# Patient Record
Sex: Male | Born: 1965
Health system: Southern US, Community
[De-identification: ages and names within clinical notes are randomized; demographics above are authoritative.]

## PROBLEM LIST (undated history)

## (undated) DIAGNOSIS — D229 Melanocytic nevi, unspecified: Secondary | ICD-10-CM

## (undated) DIAGNOSIS — IMO0001 Reserved for inherently not codable concepts without codable children: Secondary | ICD-10-CM

## (undated) DIAGNOSIS — R7303 Prediabetes: Secondary | ICD-10-CM

## (undated) DIAGNOSIS — R030 Elevated blood-pressure reading, without diagnosis of hypertension: Secondary | ICD-10-CM

## (undated) DIAGNOSIS — F419 Anxiety disorder, unspecified: Secondary | ICD-10-CM

## (undated) DIAGNOSIS — G8929 Other chronic pain: Secondary | ICD-10-CM

## (undated) DIAGNOSIS — I10 Essential (primary) hypertension: Secondary | ICD-10-CM

## (undated) DIAGNOSIS — R569 Unspecified convulsions: Secondary | ICD-10-CM

## (undated) DIAGNOSIS — K219 Gastro-esophageal reflux disease without esophagitis: Secondary | ICD-10-CM

## (undated) DIAGNOSIS — M549 Dorsalgia, unspecified: Secondary | ICD-10-CM

## (undated) DIAGNOSIS — R55 Syncope and collapse: Secondary | ICD-10-CM

## (undated) HISTORY — DX: Other chronic pain: G89.29

## (undated) HISTORY — DX: Unspecified convulsions: R56.9

## (undated) HISTORY — DX: Reserved for inherently not codable concepts without codable children: IMO0001

## (undated) HISTORY — DX: Dorsalgia, unspecified: M54.9

## (undated) HISTORY — PX: APPENDECTOMY: SHX54

## (undated) HISTORY — DX: Elevated blood-pressure reading, without diagnosis of hypertension: R03.0

## (undated) HISTORY — PX: VASECTOMY: SHX75

## (undated) HISTORY — DX: Gastro-esophageal reflux disease without esophagitis: K21.9

## (undated) HISTORY — DX: Prediabetes: R73.03

## (undated) HISTORY — PX: OTHER SURGICAL HISTORY: SHX169

## (undated) HISTORY — DX: Melanocytic nevi, unspecified: D22.9

## (undated) HISTORY — DX: Syncope and collapse: R55

---

## 2009-06-06 ENCOUNTER — Ambulatory Visit: Payer: Self-pay | Admitting: Gastroenterology

## 2009-06-06 LAB — HM COLONOSCOPY

## 2013-05-06 LAB — LIPID PANEL
CHOLESTEROL: 198 mg/dL (ref 0–200)
HDL: 42 mg/dL (ref 35–70)
LDL Cholesterol: 130 mg/dL
TRIGLYCERIDES: 130 mg/dL (ref 40–160)

## 2014-02-08 ENCOUNTER — Ambulatory Visit: Payer: Self-pay | Admitting: Family Medicine

## 2014-06-10 LAB — HEMOGLOBIN A1C: HEMOGLOBIN A1C: 5.9

## 2014-06-10 LAB — PSA: PSA: 1.7

## 2015-01-18 ENCOUNTER — Encounter: Payer: Self-pay | Admitting: Family Medicine

## 2015-01-18 ENCOUNTER — Ambulatory Visit (INDEPENDENT_AMBULATORY_CARE_PROVIDER_SITE_OTHER): Payer: BLUE CROSS/BLUE SHIELD | Admitting: Family Medicine

## 2015-01-18 VITALS — BP 136/84 | HR 70 | Temp 98.5°F | Resp 16 | Ht 66.0 in | Wt 189.8 lb

## 2015-01-18 DIAGNOSIS — M25551 Pain in right hip: Secondary | ICD-10-CM | POA: Diagnosis not present

## 2015-01-18 DIAGNOSIS — Z8781 Personal history of (healed) traumatic fracture: Secondary | ICD-10-CM | POA: Insufficient documentation

## 2015-01-18 DIAGNOSIS — G8929 Other chronic pain: Secondary | ICD-10-CM | POA: Diagnosis not present

## 2015-01-18 DIAGNOSIS — D229 Melanocytic nevi, unspecified: Secondary | ICD-10-CM | POA: Insufficient documentation

## 2015-01-18 DIAGNOSIS — Z23 Encounter for immunization: Secondary | ICD-10-CM | POA: Diagnosis not present

## 2015-01-18 DIAGNOSIS — M79644 Pain in right finger(s): Secondary | ICD-10-CM

## 2015-01-18 DIAGNOSIS — K219 Gastro-esophageal reflux disease without esophagitis: Secondary | ICD-10-CM | POA: Insufficient documentation

## 2015-01-18 DIAGNOSIS — M549 Dorsalgia, unspecified: Secondary | ICD-10-CM

## 2015-01-18 DIAGNOSIS — R739 Hyperglycemia, unspecified: Secondary | ICD-10-CM | POA: Insufficient documentation

## 2015-01-18 MED ORDER — TRAMADOL HCL 50 MG PO TABS: 50.0000 mg | ORAL_TABLET | Freq: Every evening | ORAL | Status: DC | PRN

## 2015-01-18 NOTE — Progress Notes (Signed)
Name: Kevin Bernard   MRN: [id number]    DOB: [birth year]   Date:01/18/2015       Progress Note  Subjective  Chief Complaint  Chief Complaint  Patient presents with  . Hand Pain    right onset 3 months and worsening.  In the thumb area, patient states it locks up.  . Hip Pain    right, onset 3 months and worsening.    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 It has been going on for the past few months. Pain is sharp when it locks, but otherwise aching when he applies pressure on the mcp joint. No redness, no increase in warmth.   Right hip pain: he has a history of femur fracture when he was 49 yo and has noticed right hip pain for the past few months, pain is described as daily, aching like, sometimes feels numb on his groin. Currently pain is 3/10, but can get worse at night and it affects his sleep, waking up during the night because of the pain. He has noticed that he has been limping during the pain. Pain seems to be worse when sitting or sleeping. Not taking any medication for it at this time  GERD: doing well, no heartburn or regurgitation , weaning self off Omeprazole, taking it every other day  Patient Active Problem List   Diagnosis Date Noted  . Back pain, chronic 01/18/2015  . Gastro-esophageal reflux disease without esophagitis 01/18/2015  . Numerous moles 01/18/2015  . Hyperglycemia 01/18/2015  . History of fracture of femur 01/18/2015    Past Surgical History  Procedure Laterality Date  . Appendectomy    . Vasectomy    . Right femur internal fixiation      No family history on file.  Social History   Social History  . Marital Status: Married    Spouse Name: N/A  . Number of Children: N/A  . Years of Education: N/A   Occupational History  . Not on file.   Social History Main Topics  . Smoking status: Former Research scientist (life sciences)  . Smokeless tobacco: Never Used  . Alcohol Use: 0.0 oz/week    0  Standard drinks or equivalent per week  . Drug Use: No  . Sexual Activity: Yes   Other Topics Concern  . Not on file   Social History Narrative  . No narrative on file     Current outpatient prescriptions:  .  omeprazole (PRILOSEC) 10 MG capsule, Take 10 mg by mouth daily., Disp: , Rfl:   No Known Allergies   ROS  Constitutional: Negative for fever , positive for weight change.  Respiratory: Negative for cough and shortness of breath.   Cardiovascular: Negative for chest pain or palpitations.  Gastrointestinal: Negative for abdominal pain, no bowel changes.  Musculoskeletal: Positive  for gait problem no  joint swelling.  Skin: Negative for rash.  Neurological: Negative for dizziness or headache.  No other specific complaints in a complete review of systems (except as listed in HPI above).  Objective  Filed Vitals:   01/18/15 1422  BP: 136/84  Pulse: 70  Temp: 98.5 F (36.9 C)  TempSrc: Oral  Resp: 16  Height: 5\' 6"  (1.676 m)  Weight: 189 lb 12.8 oz (86.093 kg)  SpO2: 97%    Body mass index is 30.65 kg/(m^2).  Physical Exam  Constitutional: Patient appears well-developed and well-nourished. Obese No distress.  HEENT:  head atraumatic, normocephalic, pupils equal and reactive to light, neck supple, throat within normal limits Cardiovascular: Normal rate, regular rhythm and normal heart sounds.  No murmur heard. No BLE edema. Pulmonary/Chest: Effort normal and breath sounds normal. No respiratory distress. Abdominal: Soft.  There is no tenderness. Psychiatric: Patient has a normal mood and affect. behavior is normal. Judgment and thought content normal Muscular Skeletal: pain with internal rotation of right hip, also pain with flexion of right thumb and lateral bending of right wrist, thumb did not lock during exam, no redness or increase in warmth, no pain during palpation of lumbar spine, negative straight leg raise   PHQ2/9: Depression screen PHQ 2/9  01/18/2015  Decreased Interest 0  Down, Depressed, Hopeless 0  PHQ - 2 Score 0    Fall Risk: Fall Risk  01/18/2015  Falls in the past year? No    Functional Status Survey: Is the patient deaf or have difficulty hearing?: No Does the patient have difficulty seeing, even when wearing glasses/contacts?: Yes (glasses) Does the patient have difficulty concentrating, remembering, or making decisions?: No Does the patient have difficulty walking or climbing stairs?: No Does the patient have difficulty dressing or bathing?: No Does the patient have difficulty doing errands alone such as visiting a doctor's office or shopping?: No    Assessment & Plan  1. Thumb pain, right  - Ambulatory referral to Orthopedic Surgery  2. Needs flu shot  - Flu Vaccine QUAD 36+ mos PF IM (Fluarix & Fluzone Quad PF) - refused  3. Chronic hip pain, right  - Ambulatory referral to Orthopedic Surgery - traMADol (ULTRAM) 50 MG tablet; Take 1 tablet (50 mg total) by mouth at bedtime as needed for moderate pain.  Dispense: 30 tablet; Refill: 2

## 2015-12-29 ENCOUNTER — Encounter: Payer: Self-pay | Admitting: Family Medicine

## 2015-12-29 ENCOUNTER — Ambulatory Visit (INDEPENDENT_AMBULATORY_CARE_PROVIDER_SITE_OTHER): Payer: BLUE CROSS/BLUE SHIELD | Admitting: Family Medicine

## 2015-12-29 VITALS — BP 126/74 | HR 87 | Temp 98.2°F | Resp 16 | Ht 66.0 in | Wt 192.7 lb

## 2015-12-29 DIAGNOSIS — R5383 Other fatigue: Secondary | ICD-10-CM | POA: Diagnosis not present

## 2015-12-29 DIAGNOSIS — R0683 Snoring: Secondary | ICD-10-CM

## 2015-12-29 DIAGNOSIS — Z0000 Encounter for general adult medical examination without abnormal findings: Secondary | ICD-10-CM | POA: Diagnosis not present

## 2015-12-29 DIAGNOSIS — Z13220 Encounter for screening for lipoid disorders: Secondary | ICD-10-CM

## 2015-12-29 DIAGNOSIS — R739 Hyperglycemia, unspecified: Secondary | ICD-10-CM | POA: Diagnosis not present

## 2015-12-29 DIAGNOSIS — Z23 Encounter for immunization: Secondary | ICD-10-CM | POA: Diagnosis not present

## 2015-12-29 DIAGNOSIS — Z125 Encounter for screening for malignant neoplasm of prostate: Secondary | ICD-10-CM

## 2015-12-29 DIAGNOSIS — K648 Other hemorrhoids: Secondary | ICD-10-CM | POA: Insufficient documentation

## 2015-12-29 MED ORDER — ASPIRIN EC 81 MG PO TBEC: 81.0000 mg | DELAYED_RELEASE_TABLET | Freq: Every day | ORAL | 0 refills | Status: DC

## 2015-12-29 NOTE — Progress Notes (Signed)
Name: Kevin Bernard   MRN: [id number]    DOB: 02/10/1965   Date:12/29/2015       Progress Note  Subjective  Chief Complaint  Chief Complaint  Patient presents with  . Annual Exam    HPI  Male Exam: he snores at night, wakes up multiple times during the night, he recently changed his diet - stopped eating sweets and got dizzy at work, but resolved when he ate a pie. He states erection is not as good, but not bad enough to have medication at this time.   IPSS Questionnaire (AUA-7): Over the past month.   1)  How often have you had a sensation of not emptying your bladder completely after you finish urinating?  0 - Not at all  2)  How often have you had to urinate again less than two hours after you finished urinating? 0 - Not at all  3)  How often have you found you stopped and started again several times when you urinated?  0 - Not at all  4) How difficult have you found it to postpone urination?  0 - Not at all  5) How often have you had a weak urinary stream?  0 - Not at all  6) How often have you had to push or strain to begin urination?  0 - Not at all  7) How many times did you most typically get up to urinate from the time you went to bed until the time you got up in the morning?  1 - 1 time  Total score:  0-7 mildly symptomatic   8-19 moderately symptomatic   20-35 severely symptomatic    Patient Active Problem List   Diagnosis Date Noted  . Internal hemorrhoids 12/29/2015  . Back pain, chronic 01/18/2015  . Gastro-esophageal reflux disease without esophagitis 01/18/2015  . Numerous moles 01/18/2015  . Hyperglycemia 01/18/2015  . History of fracture of femur 01/18/2015    Past Surgical History:  Procedure Laterality Date  . APPENDECTOMY    . right femur internal fixiation    . VASECTOMY      Family History  Problem Relation Age of Onset  . CVA Mother   . Cancer Father 107    Pancreatic  . Diabetes Father   . Depression Brother   . Depression Daughter   .  Epilepsy Brother     Social History   Social History  . Marital status: Married    Spouse name: Abigail Butts  . Number of children: 4  . Years of education: N/A   Occupational History  . techinal specialist  [place] History Main Topics  . Smoking status: Former Smoker    Packs/day: 1.00    Years: 2.00    Quit date: 01/30/1988  . Smokeless tobacco: Never Used  . Alcohol use 0.0 oz/week  . Drug use: No  . Sexual activity: Yes   Other Topics Concern  . Not on file   Social History Narrative   Married, they have 4 children.         Current Outpatient Prescriptions:  Marland Kitchen  Multiple Vitamin (MULTIVITAMINS PO), Take 1 tablet by mouth daily., Disp: , Rfl:  .  omeprazole (PRILOSEC) 10 MG capsule, Take 10 mg by mouth daily., Disp: , Rfl:  .  traMADol (ULTRAM) 50 MG tablet, Take 1 tablet (50 mg total) by mouth at bedtime as needed for moderate pain., Disp: 30 tablet, Rfl: 2 .  aspirin EC 81 MG tablet,  Take 1 tablet (81 mg total) by mouth daily., Disp: 30 tablet, Rfl: 0  No Known Allergies   ROS  Constitutional: Negative for fever or weight change.  Respiratory: Negative for cough and shortness of breath.   Cardiovascular: Negative for chest pain, occasional palpitations - usually when anxious.  Gastrointestinal: Negative for abdominal pain, no bowel changes.  Musculoskeletal: Negative for gait problem or joint swelling.  Skin: positive for left knee rash ( resolving ) .  Neurological: Positive  for dizziness intermittent but no  headache.  No other specific complaints in a complete review of systems (except as listed in HPI above).   Objective  Vitals:   12/29/15 0824  BP: 126/74  Pulse: 87  Resp: 16  Temp: 98.2 F (36.8 C)  TempSrc: Oral  SpO2: 96%  Weight: 192 lb 11.2 oz (87.4 kg)  Height: 5\' 6"  (1.676 m)    Body mass index is 31.1 kg/m.  Physical Exam  Constitutional: Patient appears well-developed and obese. No distress.  HENT: Head:  Normocephalic and atraumatic. Ears: B TMs ok, no erythema or effusion; Nose: Nose normal. Mouth/Throat: Oropharynx is clear and moist. No oropharyngeal exudate.  Eyes: Conjunctivae and EOM are normal. Pupils are equal, round, and reactive to light. No scleral icterus.  Neck: Normal range of motion. Neck supple. No JVD present. No thyromegaly present.  Cardiovascular: Normal rate, regular rhythm and normal heart sounds.  No murmur heard. No BLE edema. Pulmonary/Chest: Effort normal and breath sounds normal. No respiratory distress. Abdominal: Soft. Bowel sounds are normal, no distension. There is no tenderness. no masses MALE GENITALIA: Normal descended testes bilaterally, no masses palpated, no hernias, no lesions, no discharge RECTAL:rectal exam not done Musculoskeletal: Normal range of motion, no joint effusions. No gross deformities Neurological: he is alert and oriented to person, place, and time. No cranial nerve deficit. Coordination, balance, strength, speech and gait are normal.  Skin: Skin is warm and dry. No rash noted. No erythema.  Psychiatric: Patient has a normal mood and affect. behavior is normal. Judgment and thought content normal.  PHQ2/9: Depression screen [facility] 2/9 12/29/2015 01/18/2015  Decreased Interest 0 0  Down, Depressed, Hopeless 0 0  PHQ - 2 Score 0 0    Fall Risk: Fall Risk  12/29/2015 01/18/2015  Falls in the past year? No No    Functional Status Survey: Is the patient deaf or have difficulty hearing?: No Does the patient have difficulty seeing, even when wearing glasses/contacts?: No Does the patient have difficulty concentrating, remembering, or making decisions?: No Does the patient have difficulty walking or climbing stairs?: No Does the patient have difficulty dressing or bathing?: No Does the patient have difficulty doing errands alone such as visiting a doctor's office or shopping?: No   Assessment & Plan  1. Well adult exam  Discussed  importance of 150 minutes of physical activity weekly, eat two servings of fish weekly, eat one serving of tree nuts ( cashews, pistachios, pecans, almonds.Marland Kitchen) every other day, eat 6 servings of fruit/vegetables daily and drink plenty of water and avoid sweet beverages.   2. Needs flu shot  refused  3. Lipid screening  - Lipid panel  4. Hyperglycemia  - Hemoglobin A1c  5. Snoring  - ESS 14 Sleep Study  6. Other fatigue  - COMPLETE METABOLIC PANEL WITH GFR - CBC with Differential/Platelet  7. Prostate cancer screening  Discussed USPTF he would like to have PSA done - PSA

## 2015-12-30 LAB — CBC WITH DIFFERENTIAL/PLATELET
BASOS PCT: 1 %
Basophils Absolute: 64 cells/uL (ref 0–200)
EOS PCT: 1 %
Eosinophils Absolute: 64 cells/uL (ref 15–500)
HCT: 49.3 % (ref 38.5–50.0)
Hemoglobin: 17 g/dL (ref 13.2–17.1)
Lymphocytes Relative: 32 %
Lymphs Abs: 2048 cells/uL (ref 850–3900)
MCH: 30.7 pg (ref 27.0–33.0)
MCHC: 34.5 g/dL (ref 32.0–36.0)
MCV: 89 fL (ref 80.0–100.0)
MONOS PCT: 9 %
MPV: 10.5 fL (ref 7.5–12.5)
Monocytes Absolute: 576 cells/uL (ref 200–950)
NEUTROS ABS: 3648 {cells}/uL (ref 1500–7800)
Neutrophils Relative %: 57 %
PLATELETS: 202 10*3/uL (ref 140–400)
RBC: 5.54 MIL/uL (ref 4.20–5.80)
RDW: 13 % (ref 11.0–15.0)
WBC: 6.4 10*3/uL (ref 3.8–10.8)

## 2015-12-31 LAB — COMPLETE METABOLIC PANEL WITH GFR
ALBUMIN: 4.4 g/dL (ref 3.6–5.1)
ALK PHOS: 64 U/L (ref 40–115)
ALT: 22 U/L (ref 9–46)
AST: 18 U/L (ref 10–35)
BILIRUBIN TOTAL: 0.8 mg/dL (ref 0.2–1.2)
BUN: 16 mg/dL (ref 7–25)
CO2: 26 mmol/L (ref 20–31)
Calcium: 9.2 mg/dL (ref 8.6–10.3)
Chloride: 104 mmol/L (ref 98–110)
Creat: 1.09 mg/dL (ref 0.70–1.33)
GFR, EST NON AFRICAN AMERICAN: 79 mL/min (ref >=60)
Glucose, Bld: 100 mg/dL — ABNORMAL HIGH (ref 65–99)
Potassium: 4.7 mmol/L (ref 3.5–5.3)
Sodium: 138 mmol/L (ref 135–146)
TOTAL PROTEIN: 6.5 g/dL (ref 6.1–8.1)

## 2015-12-31 LAB — PSA: PSA: 1.7 ng/mL (ref <=4.0)

## 2015-12-31 LAB — LIPID PANEL
Cholesterol: 200 mg/dL — ABNORMAL HIGH (ref <200)
HDL: 37 mg/dL — AB (ref >40)
LDL CALC: 145 mg/dL — AB (ref <100)
TRIGLYCERIDES: 92 mg/dL (ref <150)
Total CHOL/HDL Ratio: 5.4 Ratio — ABNORMAL HIGH (ref <5.0)
VLDL: 18 mg/dL (ref <30)

## 2015-12-31 LAB — HEMOGLOBIN A1C
Hgb A1c MFr Bld: 5.6 % (ref <5.7)
MEAN PLASMA GLUCOSE: 114 mg/dL

## 2016-05-07 ENCOUNTER — Encounter: Payer: Self-pay | Admitting: Family Medicine

## 2016-05-07 ENCOUNTER — Ambulatory Visit (INDEPENDENT_AMBULATORY_CARE_PROVIDER_SITE_OTHER): Payer: BLUE CROSS/BLUE SHIELD | Admitting: Family Medicine

## 2016-05-07 VITALS — BP 128/74 | HR 95 | Temp 98.3°F | Resp 16 | Wt 192.9 lb

## 2016-05-07 DIAGNOSIS — F418 Other specified anxiety disorders: Secondary | ICD-10-CM | POA: Diagnosis not present

## 2016-05-07 DIAGNOSIS — K219 Gastro-esophageal reflux disease without esophagitis: Secondary | ICD-10-CM | POA: Diagnosis not present

## 2016-05-07 DIAGNOSIS — R42 Dizziness and giddiness: Secondary | ICD-10-CM

## 2016-05-07 MED ORDER — SERTRALINE HCL 50 MG PO TABS: ORAL_TABLET | ORAL | 0 refills | Status: DC

## 2016-05-07 MED ORDER — MECLIZINE HCL 25 MG PO TABS: 25.0000 mg | ORAL_TABLET | Freq: Three times a day (TID) | ORAL | 0 refills | Status: DC | PRN

## 2016-05-07 NOTE — Patient Instructions (Addendum)
Consider ranitidine for heartburn if needed Start the sertraline tomorrow in the morning Use the meclizine or antivert if needed We'll have you see the specialist (neurologist) If you have not heard anything from my staff in a week about any orders/referrals/studies from today, please contact us here to follow-up [phone]

## 2016-05-07 NOTE — Progress Notes (Signed)
BP 128/74   Pulse 95   Temp 98.3 F (36.8 C) (Oral)   Resp 16   Wt 192 lb 14.4 oz (87.5 kg)   SpO2 97%   BMI 31.13 kg/m    Subjective:    Patient ID: Kevin Bernard, male    DOB: 1965-11-18, 51 y.o.   MRN: [id number]  HPI: Kevin Bernard is a 51 y.o. male  Chief Complaint  Patient presents with  . Dizziness    with nausea, onset 2 weeks    Patient is here for an acute visit; he is new to me; his usual provider is unavailable Has been having issues with dizziness, nausea, not just 2 weeks, actually months Shadow off to the sides Regular annual eye exams; everything fine, prescription, Dr. Wallace Going Getting dizzy spells  Used to get these all the time Nothing new Occasional dizzy spells, not clearing up He has not seen ENT He had the sleep study recommended No hx of tubes or ear infections Epilepsy; age 47 was last seizure Vasovagal episodes; very cautious; last episode 9-10 years No old head injuries Some spinning, not sure wihich Feel like he's going to pass out Happening daily Fine right now Forgot where he was going to work, got his bearings, used to driving same route and that was a few months ago Prediabetic Omeprazole was taking one a day for reflux, then stopped (weaned) Spicy foods are triggers Heart rate staying in the 90s Anxiety associated with these; has panic attacks; worried wife this weekend; he never cires and he was anxious like he had seen a ghost; sitting completely relaxed No anti-anxiety medicines have been tried Anxiety part just started, not the dizzy spells Friend had vertigo; asked if medicine  Depression screen [facility] 2/9 05/07/2016 12/29/2015 01/18/2015  Decreased Interest 0 0 0  Down, Depressed, Hopeless 0 0 0  PHQ - 2 Score 0 0 0   Relevant past medical, surgical, family and social history reviewed Past Medical History:  Diagnosis Date  . Chronic back pain   . Elevated blood pressure   . GERD (gastroesophageal reflux disease)   .  Numerous moles   . Pre-diabetes   . Seizures ([place])   . Vasovagal syncope    Past Surgical History:  Procedure Laterality Date  . APPENDECTOMY    . right femur internal fixiation    . VASECTOMY     Family History  Problem Relation Age of Onset  . CVA Mother   . Cancer Father 46    Pancreatic  . Diabetes Father   . Depression Brother   . Depression Daughter   . Epilepsy Brother    Social History  Substance Use Topics  . Smoking status: Former Smoker    Packs/day: 1.00    Years: 2.00    Quit date: 01/30/1988  . Smokeless tobacco: Never Used  . Alcohol use 0.0 oz/week   Interim medical history since last visit reviewed. Allergies and medications reviewed  Review of Systems Per HPI unless specifically indicated above     Objective:    BP 128/74   Pulse 95   Temp 98.3 F (36.8 C) (Oral)   Resp 16   Wt 192 lb 14.4 oz (87.5 kg)   SpO2 97%   BMI 31.13 kg/m   Wt Readings from Last 3 Encounters:  05/07/16 192 lb 14.4 oz (87.5 kg)  12/29/15 192 lb 11.2 oz (87.4 kg)  01/18/15 189 lb 12.8 oz (86.1 kg)    Physical Exam  Constitutional: He appears well-developed and well-nourished. No distress.  HENT:  Head: Normocephalic and atraumatic.  Right Ear: Hearing, tympanic membrane, external ear and ear canal normal. No drainage. Tympanic membrane is not erythematous and not retracted. No middle ear effusion.  Left Ear: Hearing, tympanic membrane, external ear and ear canal normal. No drainage. Tympanic membrane is not erythematous and not retracted.  No middle ear effusion.  Nose: No rhinorrhea.  Mouth/Throat: Oropharynx is clear and moist.  Eyes: Conjunctivae and EOM are normal. Pupils are equal, round, and reactive to light. No scleral icterus. Right eye exhibits normal extraocular motion and no nystagmus. Left eye exhibits normal extraocular motion and no nystagmus.  Temporal visual fields intact bilaterally  Neck: No JVD present. Carotid bruit is not present. No  thyromegaly present.  Cardiovascular: Normal rate and regular rhythm.   Pulmonary/Chest: Effort normal and breath sounds normal.  Abdominal: Soft. Bowel sounds are normal. He exhibits no distension.  Musculoskeletal: He exhibits no edema.  Neurological: He displays no atrophy and no tremor. No cranial nerve deficit or sensory deficit. He exhibits normal muscle tone. He displays no seizure activity. Coordination and gait normal.  Romberg testing showed just slight sway to the right; no pronator drift; no dysdiadochokinesis; normal finger-to-nose and finger-to-nose-to-finger testing; heel-shin testing normal; normal gait, heel-toe walk, dorsiflexion, plantarflexion walk all normal  Skin: Skin is warm and dry. No pallor.  Psychiatric: He has a normal mood and affect. His behavior is normal. Judgment and thought content normal.   Results for orders placed or performed in visit on 12/29/15  Hemoglobin A1c  Result Value Ref Range   Hgb A1c MFr Bld 5.6 <5.7 %   Mean Plasma Glucose 114 mg/dL  PSA  Result Value Ref Range   PSA 1.7 <=4.0 ng/mL  Lipid panel  Result Value Ref Range   Cholesterol 200 (H) <200 mg/dL   Triglycerides 92 <150 mg/dL   HDL 37 (L) >40 mg/dL   Total CHOL/HDL Ratio 5.4 (H) <5.0 Ratio   VLDL 18 <30 mg/dL   LDL Cholesterol 145 (H) <100 mg/dL  COMPLETE METABOLIC PANEL WITH GFR  Result Value Ref Range   Sodium 138 135 - 146 mmol/L   Potassium 4.7 3.5 - 5.3 mmol/L   Chloride 104 98 - 110 mmol/L   CO2 26 20 - 31 mmol/L   Glucose, Bld 100 (H) 65 - 99 mg/dL   BUN 16 7 - 25 mg/dL   Creat 1.09 0.70 - 1.33 mg/dL   Total Bilirubin 0.8 0.2 - 1.2 mg/dL   Alkaline Phosphatase 64 40 - 115 U/L   AST 18 10 - 35 U/L   ALT 22 9 - 46 U/L   Total Protein 6.5 6.1 - 8.1 g/dL   Albumin 4.4 3.6 - 5.1 g/dL   Calcium 9.2 8.6 - 10.3 mg/dL   GFR, Est African American >89 >=60 mL/min   GFR, Est Non African American 79 >=60 mL/min  CBC with Differential/Platelet  Result Value Ref Range    WBC 6.4 3.8 - 10.8 K/uL   RBC 5.54 4.20 - 5.80 MIL/uL   Hemoglobin 17.0 13.2 - 17.1 g/dL   HCT 49.3 38.5 - 50.0 %   MCV 89.0 80.0 - 100.0 fL   MCH 30.7 27.0 - 33.0 pg   MCHC 34.5 32.0 - 36.0 g/dL   RDW 13.0 11.0 - 15.0 %   Platelets 202 140 - 400 K/uL   MPV 10.5 7.5 - 12.5 fL   Neutro Abs 3,648 1,500 -  7,800 cells/uL   Lymphs Abs 2,048 850 - 3,900 cells/uL   Monocytes Absolute 576 200 - 950 cells/uL   Eosinophils Absolute 64 15 - 500 cells/uL   Basophils Absolute 64 0 - 200 cells/uL   Neutrophils Relative % 57 %   Lymphocytes Relative 32 %   Monocytes Relative 9 %   Eosinophils Relative 1 %   Basophils Relative 1 %   Smear Review Criteria for review not met   HM COLONOSCOPY  Result Value Ref Range   HM Colonoscopy See Report (in chart) See Report (in chart), Patient Reported      Assessment & Plan:   Problem List Items Addressed This Visit      Digestive   Gastro-esophageal reflux disease without esophagitis    Will have him use H2 blocker in place of PPI      Relevant Medications   meclizine (ANTIVERT) 25 MG tablet     Other   Anxiety about health    Discussed options and patient and his wife would like him to start on medicine; f/u with Dr. Ancil Boozer in a few weeks, call sooner if any problems      Relevant Medications   sertraline (ZOLOFT) 50 MG tablet    Other Visit Diagnoses    Dizziness    -  Primary   discussed ddx; I'd like him to see neurology; rx for antivert; will hold on brain imaging ($$) as neuro may opt for MRI over CT scan; disc s/s stroke   Relevant Medications   sertraline (ZOLOFT) 50 MG tablet   meclizine (ANTIVERT) 25 MG tablet   Other Relevant Orders   Ambulatory referral to Neurology      Follow up plan: No Follow-up on file.  An after-visit summary was printed and given to the patient at [facility].  Please see the patient instructions which may contain other information and recommendations beyond what is mentioned above in the assessment  and plan.  Meds ordered this encounter  Medications  . sertraline (ZOLOFT) 50 MG tablet    Sig: Take one-half of a pill by mouth daily for four days, then one whole pill daily    Dispense:  28 tablet    Refill:  0  . meclizine (ANTIVERT) 25 MG tablet    Sig: Take 1 tablet (25 mg total) by mouth 3 (three) times daily as needed for dizziness.    Dispense:  30 tablet    Refill:  0    Orders Placed This Encounter  Procedures  . Ambulatory referral to Neurology   Discussed s/s of stroke, reasons to call 911 Face-to-face time with patient was more than 25 minutes, >50% time spent counseling and coordination of care

## 2016-05-08 DIAGNOSIS — F418 Other specified anxiety disorders: Secondary | ICD-10-CM | POA: Insufficient documentation

## 2016-05-08 DIAGNOSIS — R4589 Other symptoms and signs involving emotional state: Secondary | ICD-10-CM | POA: Insufficient documentation

## 2016-05-08 NOTE — Assessment & Plan Note (Signed)
Will have him use H2 blocker in place of PPI

## 2016-05-08 NOTE — Assessment & Plan Note (Signed)
Discussed options and patient and his wife would like him to start on medicine; f/u with Dr. Ancil Boozer in a few weeks, call sooner if any problems

## 2016-05-10 ENCOUNTER — Telehealth: Payer: Self-pay | Admitting: Family Medicine

## 2016-05-10 MED ORDER — RANITIDINE HCL 300 MG PO TABS: 300.0000 mg | ORAL_TABLET | Freq: Every day | ORAL | 3 refills | Status: DC

## 2016-05-10 NOTE — Telephone Encounter (Signed)
Patient is wanting a RX for an antacid medication to be sent the [facility] in [place] on [address]  Patient stated he discussed with Dr. Sanda Klein during his visit on 05/07/16.

## 2016-05-12 ENCOUNTER — Emergency Department (HOSPITAL_BASED_OUTPATIENT_CLINIC_OR_DEPARTMENT_OTHER): Payer: BLUE CROSS/BLUE SHIELD

## 2016-05-12 ENCOUNTER — Emergency Department (HOSPITAL_BASED_OUTPATIENT_CLINIC_OR_DEPARTMENT_OTHER)
Admission: EM | Admit: 2016-05-12 | Discharge: 2016-05-12 | Disposition: A | Discharge status: Home / Self Care | Payer: BLUE CROSS/BLUE SHIELD | Attending: Emergency Medicine | Admitting: Emergency Medicine

## 2016-05-12 ENCOUNTER — Encounter (HOSPITAL_BASED_OUTPATIENT_CLINIC_OR_DEPARTMENT_OTHER): Payer: Self-pay | Admitting: Emergency Medicine

## 2016-05-12 DIAGNOSIS — R55 Syncope and collapse: Secondary | ICD-10-CM | POA: Insufficient documentation

## 2016-05-12 DIAGNOSIS — Z87891 Personal history of nicotine dependence: Secondary | ICD-10-CM | POA: Diagnosis not present

## 2016-05-12 DIAGNOSIS — R5383 Other fatigue: Secondary | ICD-10-CM | POA: Insufficient documentation

## 2016-05-12 DIAGNOSIS — R0602 Shortness of breath: Secondary | ICD-10-CM | POA: Diagnosis not present

## 2016-05-12 DIAGNOSIS — R410 Disorientation, unspecified: Secondary | ICD-10-CM | POA: Diagnosis not present

## 2016-05-12 DIAGNOSIS — Z79899 Other long term (current) drug therapy: Secondary | ICD-10-CM | POA: Diagnosis not present

## 2016-05-12 DIAGNOSIS — R002 Palpitations: Secondary | ICD-10-CM | POA: Diagnosis not present

## 2016-05-12 DIAGNOSIS — R42 Dizziness and giddiness: Secondary | ICD-10-CM | POA: Insufficient documentation

## 2016-05-12 DIAGNOSIS — R0981 Nasal congestion: Secondary | ICD-10-CM | POA: Insufficient documentation

## 2016-05-12 HISTORY — DX: Anxiety disorder, unspecified: F41.9

## 2016-05-12 LAB — BASIC METABOLIC PANEL
ANION GAP: 8 (ref 5–15)
BUN: 18 mg/dL (ref 6–20)
CALCIUM: 8.9 mg/dL (ref 8.9–10.3)
CHLORIDE: 108 mmol/L (ref 101–111)
CO2: 22 mmol/L (ref 22–32)
Creatinine, Ser: 0.89 mg/dL (ref 0.61–1.24)
GFR calc non Af Amer: >60 mL/min (ref >60)
Glucose, Bld: 120 mg/dL — ABNORMAL HIGH (ref 65–99)
Potassium: 3.2 mmol/L — ABNORMAL LOW (ref 3.5–5.1)
SODIUM: 138 mmol/L (ref 135–145)

## 2016-05-12 LAB — HEPATIC FUNCTION PANEL
ALBUMIN: 4.3 g/dL (ref 3.5–5.0)
ALT: 24 U/L (ref 17–63)
AST: 23 U/L (ref 15–41)
Alkaline Phosphatase: 73 U/L (ref 38–126)
BILIRUBIN TOTAL: 0.7 mg/dL (ref 0.3–1.2)
Bilirubin, Direct: 0.1 mg/dL (ref 0.1–0.5)
Indirect Bilirubin: 0.6 mg/dL (ref 0.3–0.9)
TOTAL PROTEIN: 7 g/dL (ref 6.5–8.1)

## 2016-05-12 LAB — CBC
HEMATOCRIT: 43.5 % (ref 39.0–52.0)
Hemoglobin: 15.5 g/dL (ref 13.0–17.0)
MCH: 30.6 pg (ref 26.0–34.0)
MCHC: 35.6 g/dL (ref 30.0–36.0)
MCV: 86 fL (ref 78.0–100.0)
PLATELETS: 213 10*3/uL (ref 150–400)
RBC: 5.06 MIL/uL (ref 4.22–5.81)
RDW: 12.1 % (ref 11.5–15.5)
WBC: 7.6 10*3/uL (ref 4.0–10.5)

## 2016-05-12 LAB — TSH: TSH: 1.488 u[IU]/mL (ref 0.350–4.500)

## 2016-05-12 LAB — TROPONIN I: Troponin I: <0.03 ng/mL (ref <0.03)

## 2016-05-12 NOTE — ED Provider Notes (Signed)
[facility] Provider Note   CSN: [id number] Arrival date & time: 05/12/16  1020     History   Chief Complaint Chief Complaint  Patient presents with  . Palpitations    HPI Xerxes Agrusa is a 51 y.o. male.  Patient presents with concerns for rapid heart rate palpitations. Occurred at work. Not associated with chest pain. Some shortness of breath intermittent [place] for several weeks. Room air sats 100%. Patient seen by primary care doctor on Monday with lab tests done. For these complaints. Patient just hasn't felt right for about 3 weeks. Or longer. Patient recently started on Zoloft but symptoms were present prior to starting that. Patient states that he's been feeling as if he is going to pass out. Had some dizziness and lightheadedness but no true vertigo or room spinning. Primary care Dr. started on meclizine but he has not taken any of it. He is taking the Zoloft as prescribed. Again he just started that a few days ago. No worse symptoms since starting the Zoloft. Patient denies any significant headache he's had some congestion. No fevers. No motor weakness no numbness. No neck stiffness. No prior history of palpitations.      Past Medical History:  Diagnosis Date  . Anxiety   . Chronic back pain   . Elevated blood pressure   . GERD (gastroesophageal reflux disease)   . Numerous moles   . Pre-diabetes   . Seizures ([place])   . Vasovagal syncope     Patient Active Problem List   Diagnosis Date Noted  . Anxiety about health 05/08/2016  . Internal hemorrhoids 12/29/2015  . Back pain, chronic 01/18/2015  . Gastro-esophageal reflux disease without esophagitis 01/18/2015  . Numerous moles 01/18/2015  . Hyperglycemia 01/18/2015  . History of fracture of femur 01/18/2015    Past Surgical History:  Procedure Laterality Date  . APPENDECTOMY    . right femur internal fixiation    . VASECTOMY         Home Medications    Prior to Admission medications     Medication Sig Start Date End Date Taking? Authorizing Provider  ranitidine (ZANTAC) 300 MG tablet Take 1 tablet (300 mg total) by mouth at bedtime. 05/10/16  Yes Arnetha Courser, MD  sertraline (ZOLOFT) 50 MG tablet Take one-half of a pill by mouth daily for four days, then one whole pill daily 05/07/16  Yes Arnetha Courser, MD  aspirin EC 81 MG tablet Take 1 tablet (81 mg total) by mouth daily. 12/29/15   Steele Sizer, MD  meclizine (ANTIVERT) 25 MG tablet Take 1 tablet (25 mg total) by mouth 3 (three) times daily as needed for dizziness. 05/07/16   Arnetha Courser, MD  Multiple Vitamin (MULTIVITAMINS PO) Take 1 tablet by mouth daily.    Historical Provider, MD    Family History Family History  Problem Relation Age of Onset  . CVA Mother   . Cancer Father 76    Pancreatic  . Diabetes Father   . Depression Brother   . Depression Daughter   . Epilepsy Brother     Social History Social History  Substance Use Topics  . Smoking status: Former Smoker    Packs/day: 1.00    Years: 2.00    Quit date: 01/30/1988  . Smokeless tobacco: Never Used  . Alcohol use 0.0 oz/week     Allergies   Patient has no known allergies.   Review of Systems Review of Systems  Constitutional: Positive for  fatigue. Negative for fever.  HENT: Positive for congestion.   Eyes: Negative for visual disturbance.  Respiratory: Negative for shortness of breath.   Cardiovascular: Positive for palpitations. Negative for chest pain and leg swelling.  Gastrointestinal: Negative for abdominal pain, nausea and vomiting.  Genitourinary: Negative for dysuria.  Musculoskeletal: Negative for back pain and neck pain.  Skin: Negative for rash.  Neurological: Positive for dizziness and light-headedness. Negative for syncope, speech difficulty, weakness, numbness and headaches.  Hematological: Does not bruise/bleed easily.  Psychiatric/Behavioral: Positive for confusion. The patient is nervous/anxious.      Physical  Exam Updated Vital Signs BP (!) 138/95   Pulse 66   Temp 97.8 F (36.6 C) (Oral)   Resp 10   Ht 5\' 6"  (1.676 m)   Wt 83.9 kg   SpO2 98%   BMI 29.86 kg/m   Physical Exam  Constitutional: He is oriented to person, place, and time. He appears well-developed and well-nourished. No distress.  HENT:  Head: Normocephalic and atraumatic.  Mouth/Throat: Oropharynx is clear and moist.  Eyes: Conjunctivae and EOM are normal. Pupils are equal, round, and reactive to light.  Neck: Normal range of motion. Neck supple.  Cardiovascular: Normal rate, regular rhythm and normal heart sounds.   No murmur heard. Pulmonary/Chest: Effort normal and breath sounds normal. No respiratory distress.  Abdominal: Soft. Bowel sounds are normal. There is no tenderness.  Musculoskeletal: Normal range of motion.  Neurological: He is alert and oriented to person, place, and time. No cranial nerve deficit or sensory deficit. He exhibits normal muscle tone. Coordination normal.  Skin: Skin is warm.  Nursing note and vitals reviewed.    ED Treatments / Results  Labs (all labs ordered are listed, but only abnormal results are displayed) Labs Reviewed  BASIC METABOLIC PANEL - Abnormal; Notable for the following:       Result Value   Potassium 3.2 (*)    Glucose, Bld 120 (*)    All other components within normal limits  CBC  TROPONIN I  HEPATIC FUNCTION PANEL  TSH    EKG  EKG Interpretation  Date/Time:  Saturday May 12 2016 10:28:39 EDT Ventricular Rate:  94 PR Interval:  128 QRS Duration: 94 QT Interval:  376 QTC Calculation: 470 R Axis:   60 Text Interpretation:  Normal sinus rhythm Normal ECG No previous ECGs available Confirmed by Braxxton Stoudt  MD, Amra Shukla [phone]) on 05/12/2016 11:38:25 AM       Radiology Dg Chest 2 View  Result Date: 05/12/2016 CLINICAL DATA:  Shortness of breath and palpitations for several weeks. EXAM: CHEST  2 VIEW COMPARISON:  None. FINDINGS: This is a mildly low volume  film. The cardiomediastinal silhouette is unremarkable. There is no evidence of focal airspace disease, pulmonary edema, suspicious pulmonary nodule/mass, pleural effusion, or pneumothorax. No acute bony abnormalities are identified. IMPRESSION: No active cardiopulmonary disease. Electronically Signed   By: Margarette Canada M.D.   On: 05/12/2016 11:10   Ct Head Wo Contrast  Result Date: 05/12/2016 CLINICAL DATA:  Hx multiple syncopal episodes in past, some nausea, having heart palpitations x several weeks with sob EXAM: CT HEAD WITHOUT CONTRAST TECHNIQUE: Contiguous axial images were obtained from the base of the skull through the vertex without intravenous contrast. COMPARISON:  None. FINDINGS: Brain: No acute intracranial hemorrhage. No focal mass lesion. No CT evidence of acute infarction. No midline shift or mass effect. No hydrocephalus. Basilar cisterns are patent. Mild white matter hypodensities in the anterior limb of the  LEFT external capsule (image 12 series 1) Vascular: No hyperdense vessel or unexpected calcification. Skull: Normal. Negative for fracture or focal lesion. Sinuses/Orbits: No acute finding. Other: None. IMPRESSION: 1. No acute intracranial findings. 2. Minimal white matter microvascular disease. Electronically Signed   By: Suzy Bouchard M.D.   On: 05/12/2016 13:34    Procedures Procedures (including critical care time)  Medications Ordered in ED Medications - No data to display   Initial Impression / Assessment and Plan / ED Course  I have reviewed the triage vital signs and the nursing notes.  Pertinent labs & imaging results that were available during my care of the patient were reviewed by me and considered in my medical decision making (see chart for details).     Workup for the palpitations and feeling of near syncope without any acute findings. Chest x-ray head CT negative labs without significant abnormalities. Patient with cardiac monitoring here had no  arrhythmias. EKG without any acute findings. Thyroid stimulated hormone was ordered and is pending.   Recommend follow-up with your regular Dr. for recheck of the thyroid testing. Return for racing heart or palpitations lasting 40 minutes or longer.  Continue current medications.  Final Clinical Impressions(s) / ED Diagnoses   Final diagnoses:  Palpitations  Near syncope    New Prescriptions Discharge Medication List as of 05/12/2016  2:13 PM       Fredia Sorrow, MD 05/12/16 1635

## 2016-05-12 NOTE — Discharge Instructions (Signed)
Workup here without any acute findings. Recommend following back up with primary care doctor. Thyroid-stimulating hormone was ordered and is still pending. He'll need follow-up with your regular doctor get the results of that. Return for any new or worse symptoms. Return for any heart racing for 40 minutes or longer. No evidence of any arrhythmia or abnormalities on cardiac monitor EKG today. Head CT was negative chest x-ray was negative labs without significant abnormalities other than some mild low potassium.

## 2016-05-12 NOTE — ED Triage Notes (Signed)
Pt reports palpitations and SOB for intermittently for a few weeks. Today it started a few hours ago. Denies chests pain. Recently started on zoloft.

## 2016-05-12 NOTE — ED Notes (Signed)
Pt appears anxious. He was discussing multiple personal issues from home. Pt seems fidgety with his hands. Transported to x-ray.

## 2016-05-18 ENCOUNTER — Telehealth: Payer: Self-pay

## 2016-05-18 ENCOUNTER — Encounter: Payer: Self-pay | Admitting: Family Medicine

## 2016-05-18 ENCOUNTER — Ambulatory Visit (INDEPENDENT_AMBULATORY_CARE_PROVIDER_SITE_OTHER): Payer: BLUE CROSS/BLUE SHIELD | Admitting: Family Medicine

## 2016-05-18 VITALS — BP 116/74 | HR 81 | Temp 98.2°F | Resp 16 | Wt 184.2 lb

## 2016-05-18 DIAGNOSIS — R9431 Abnormal electrocardiogram [ECG] [EKG]: Secondary | ICD-10-CM

## 2016-05-18 DIAGNOSIS — F410 Panic disorder [episodic paroxysmal anxiety] without agoraphobia: Secondary | ICD-10-CM | POA: Diagnosis not present

## 2016-05-18 DIAGNOSIS — R000 Tachycardia, unspecified: Secondary | ICD-10-CM

## 2016-05-18 DIAGNOSIS — E876 Hypokalemia: Secondary | ICD-10-CM

## 2016-05-18 MED ORDER — POTASSIUM CHLORIDE ER 10 MEQ PO TBCR: 10.0000 meq | EXTENDED_RELEASE_TABLET | Freq: Two times a day (BID) | ORAL | 0 refills | Status: DC

## 2016-05-18 MED ORDER — FLUOXETINE HCL 20 MG PO TABS: 20.0000 mg | ORAL_TABLET | Freq: Every day | ORAL | 3 refills | Status: DC

## 2016-05-18 MED ORDER — LORAZEPAM 0.5 MG PO TABS: 0.2500 mg | ORAL_TABLET | Freq: Two times a day (BID) | ORAL | 0 refills | Status: DC | PRN

## 2016-05-18 NOTE — Progress Notes (Signed)
BP 116/74   Pulse 81   Temp 98.2 F (36.8 C) (Oral)   Resp 16   Wt 184 lb 3.2 oz (83.6 kg)   SpO2 95%   BMI 29.73 kg/m    Subjective:    Patient ID: Kevin Bernard, male    DOB: July 20, 1965, 51 y.o.   MRN: [id number]  HPI: Kevin Bernard is a 51 y.o. male  Chief Complaint  Patient presents with  . Follow-up    Medication ( dizziness )    HPI Patient is here with his wife for follow-up He actually went to the ER on 05/12/16; note reviewed, along with labs and EKG and CXR and head CT Heart rate was 140, 149, stayed in the 130s and wouldn't go away Sweaty and felt a little dizziness Potassium was low at 3.2; patient does not recall any replacement TSH normal Normal CBC No outbursts of anger Beads of sweats with this Gets super hot No dripping Reviewed EKG, no delta waves He had a holter monitor years ago More than one aunt has had a pacemaker on the father's side ------------------------------------------------- Head CT from 05/12/16 IMPRESSION: 1. No acute intracranial findings. 2. Minimal white matter microvascular disease.   Electronically Signed   By: Suzy Bouchard M.D.   On: 05/12/2016 13:34 ------------------------------------------------- Chest xray COMPARISON:  None.  FINDINGS: This is a mildly low volume film.  The cardiomediastinal silhouette is unremarkable.  There is no evidence of focal airspace disease, pulmonary edema, suspicious pulmonary nodule/mass, pleural effusion, or pneumothorax. No acute bony abnormalities are identified.  IMPRESSION: No active cardiopulmonary disease.   Electronically Signed   By: Margarette Canada M.D.   On: 05/12/2016 11:10 --------------------------------------------------  High cholesterol; not taking aspirin His daughter has panic attacks and anxiety and OCD; she is on fluoxetine and he is willing to try that  Depression screen [facility] 2/9 05/18/2016 05/07/2016 12/29/2015 01/18/2015  Decreased Interest 0 0 0 0    Down, Depressed, Hopeless 0 0 0 0  PHQ - 2 Score 0 0 0 0   Relevant past medical, surgical, family and social history reviewed Past Medical History:  Diagnosis Date  . Anxiety   . Chronic back pain   . Elevated blood pressure   . GERD (gastroesophageal reflux disease)   . Numerous moles   . Pre-diabetes   . Seizures ([place])   . Vasovagal syncope    Past Surgical History:  Procedure Laterality Date  . APPENDECTOMY    . right femur internal fixiation    . VASECTOMY     Family History  Problem Relation Age of Onset  . CVA Mother   . Cancer Father 56    Pancreatic  . Diabetes Father   . Depression Brother   . Depression Daughter   . Epilepsy Brother    Social History  Substance Use Topics  . Smoking status: Former Smoker    Packs/day: 1.00    Years: 2.00    Quit date: 01/30/1988  . Smokeless tobacco: Never Used  . Alcohol use 0.0 oz/week    Interim medical history since last visit reviewed. Allergies and medications reviewed  Review of Systems Per HPI unless specifically indicated above     Objective:    BP 116/74   Pulse 81   Temp 98.2 F (36.8 C) (Oral)   Resp 16   Wt 184 lb 3.2 oz (83.6 kg)   SpO2 95%   BMI 29.73 kg/m   Wt Readings from Last 3 Encounters:  05/18/16 184 lb 3.2 oz (83.6 kg)  05/12/16 185 lb (83.9 kg)  05/07/16 192 lb 14.4 oz (87.5 kg)    Physical Exam  Constitutional: He appears well-developed and well-nourished. No distress.  HENT:  Head: Normocephalic and atraumatic.  Right Ear: Hearing, tympanic membrane, external ear and ear canal normal. No drainage. Tympanic membrane is not erythematous and not retracted. No middle ear effusion.  Left Ear: Hearing, tympanic membrane, external ear and ear canal normal. No drainage. Tympanic membrane is not erythematous and not retracted.  No middle ear effusion.  Nose: No rhinorrhea.  Eyes: EOM are normal. No scleral icterus. Right eye exhibits normal extraocular motion and no nystagmus. Left eye  exhibits normal extraocular motion and no nystagmus.  Temporal visual fields intact bilaterally  Neck: No JVD present. Carotid bruit is not present.  Cardiovascular: Normal rate and regular rhythm.   Pulmonary/Chest: Effort normal and breath sounds normal.  Abdominal: Soft. Bowel sounds are normal. He exhibits no distension.  Musculoskeletal: He exhibits no edema.  Neurological: He displays no tremor.  Skin: No pallor.  Psychiatric: He has a normal mood and affect. His behavior is normal. Judgment and thought content normal.   Results for orders placed or performed during the hospital encounter of 05/12/16  CBC  Result Value Ref Range   WBC 7.6 4.0 - 10.5 K/uL   RBC 5.06 4.22 - 5.81 MIL/uL   Hemoglobin 15.5 13.0 - 17.0 g/dL   HCT 43.5 39.0 - 52.0 %   MCV 86.0 78.0 - 100.0 fL   MCH 30.6 26.0 - 34.0 pg   MCHC 35.6 30.0 - 36.0 g/dL   RDW 12.1 11.5 - 15.5 %   Platelets 213 150 - 400 K/uL  Troponin I  Result Value Ref Range   Troponin I <0.03 <0.03 ng/mL  Basic metabolic panel  Result Value Ref Range   Sodium 138 135 - 145 mmol/L   Potassium 3.2 (L) 3.5 - 5.1 mmol/L   Chloride 108 101 - 111 mmol/L   CO2 22 22 - 32 mmol/L   Glucose, Bld 120 (H) 65 - 99 mg/dL   BUN 18 6 - 20 mg/dL   Creatinine, Ser 0.89 0.61 - 1.24 mg/dL   Calcium 8.9 8.9 - 10.3 mg/dL   GFR calc non Af Amer >60 >60 mL/min   GFR calc Af Amer >60 >60 mL/min   Anion gap 8 5 - 15  Hepatic function panel  Result Value Ref Range   Total Protein 7.0 6.5 - 8.1 g/dL   Albumin 4.3 3.5 - 5.0 g/dL   AST 23 15 - 41 U/L   ALT 24 17 - 63 U/L   Alkaline Phosphatase 73 38 - 126 U/L   Total Bilirubin 0.7 0.3 - 1.2 mg/dL   Bilirubin, Direct 0.1 0.1 - 0.5 mg/dL   Indirect Bilirubin 0.6 0.3 - 0.9 mg/dL  TSH  Result Value Ref Range   TSH 1.488 0.350 - 4.500 uIU/mL  reviewed in detail w/patient and his wife     Assessment & Plan:   Problem List Items Addressed This Visit      Other   Tachycardia - Primary    Unlikely  that heart rate would go to 140s with just anxiety, panic attack      Relevant Orders   Ambulatory referral to Cardiology   Panic attack    Not sure if panic attacks are causing cardiac symptoms or other way around; will start fluoxetine, very limited Rx for benzo  given while working this up; discussed risk of addition, never ever mix with narcotics, alcohol, sleeping pills, etc.      Relevant Medications   FLUoxetine (PROZAC) 20 MG tablet   LORazepam (ATIVAN) 0.5 MG tablet   Abnormal EKG    Refer to cardiologist for symptoms, borderline QTc prolongation; explained that I am doubtful that a panic attack alone could send a heart rate to 149; possible, but not that I've seen; thyroid testing normal; patient will see cardiologist who may put him on beta-blocker, but I'm not going to do that today given his BP      Relevant Orders   Ambulatory referral to Cardiology    Other Visit Diagnoses    Hypokalemia       replacement given (Rx)       Follow up plan: No Follow-up on file.  An after-visit summary was printed and given to the patient at [facility].  Please see the patient instructions which may contain other information and recommendations beyond what is mentioned above in the assessment and plan.  Meds ordered this encounter  Medications  . potassium chloride (KLOR-CON 10) 10 MEQ tablet    Sig: Take 1 tablet (10 mEq total) by mouth 2 (two) times daily.    Dispense:  8 tablet    Refill:  0  . FLUoxetine (PROZAC) 20 MG tablet    Sig: Take 1 tablet (20 mg total) by mouth daily.    Dispense:  30 tablet    Refill:  3  . LORazepam (ATIVAN) 0.5 MG tablet    Sig: Take 0.5-1 tablets (0.25-0.5 mg total) by mouth 2 (two) times daily as needed for anxiety.    Dispense:  12 tablet    Refill:  0    Orders Placed This Encounter  Procedures  . Ambulatory referral to Cardiology

## 2016-05-18 NOTE — Patient Instructions (Addendum)
Try to limit saturated fats in your diet (bologna, hot dogs, barbeque, cheeseburgers, hamburgers, steak, bacon, sausage, cheese, etc.) and get more fresh fruits, vegetables, and whole grains Let's get you to see a cardiologist Stop the sertraline Start the fluoxetine tomorrow Use the lorazepam for stormy moments, panic attacks Do not drink any alcohol or take pain medicine (narcotics), or sleeping pills (melatonin is okay) within six hours of the panic attack medicine lorazepam Call me in 3 weeks with an update

## 2016-05-18 NOTE — Telephone Encounter (Signed)
[facility] pharm tech called to confirm that the patient is not on Zoloft and now on Prozac. Confirmed that Dr. lada canceled Zoloft and he is now on Prozac.

## 2016-05-18 NOTE — Assessment & Plan Note (Signed)
Unlikely that heart rate would go to 140s with just anxiety, panic attack

## 2016-05-18 NOTE — Assessment & Plan Note (Addendum)
Refer to cardiologist for symptoms, borderline QTc prolongation; explained that I am doubtful that a panic attack alone could send a heart rate to 149; possible, but not that I've seen; thyroid testing normal; patient will see cardiologist who may put him on beta-blocker, but I'm not going to do that today given his BP

## 2016-05-20 DIAGNOSIS — F410 Panic disorder [episodic paroxysmal anxiety] without agoraphobia: Secondary | ICD-10-CM | POA: Insufficient documentation

## 2016-05-20 NOTE — Assessment & Plan Note (Signed)
Not sure if panic attacks are causing cardiac symptoms or other way around; will start fluoxetine, very limited Rx for benzo given while working this up; discussed risk of addition, never ever mix with narcotics, alcohol, sleeping pills, etc.

## 2016-07-30 ENCOUNTER — Encounter: Payer: Self-pay | Admitting: Family Medicine

## 2016-07-30 ENCOUNTER — Ambulatory Visit (INDEPENDENT_AMBULATORY_CARE_PROVIDER_SITE_OTHER): Payer: BLUE CROSS/BLUE SHIELD | Admitting: Family Medicine

## 2016-07-30 VITALS — BP 128/84 | HR 87 | Temp 98.2°F | Resp 16 | Ht 66.0 in | Wt 179.2 lb

## 2016-07-30 DIAGNOSIS — F410 Panic disorder [episodic paroxysmal anxiety] without agoraphobia: Secondary | ICD-10-CM | POA: Diagnosis not present

## 2016-07-30 DIAGNOSIS — K219 Gastro-esophageal reflux disease without esophagitis: Secondary | ICD-10-CM | POA: Diagnosis not present

## 2016-07-30 MED ORDER — CITALOPRAM HYDROBROMIDE 10 MG PO TABS
10.0000 mg | ORAL_TABLET | Freq: Every day | ORAL | 0 refills | Status: DC
Start: 2016-07-30 — End: 2016-08-15

## 2016-07-30 MED ORDER — RANITIDINE HCL 300 MG PO TABS: 300.0000 mg | ORAL_TABLET | Freq: Every day | ORAL | 1 refills | Status: DC

## 2016-07-30 NOTE — Patient Instructions (Signed)
Today (07/30/16), Tomorrow (07/31/2016), and Wendesday (08/01/2016): Take 20mg  Prozac + 10mg  Celexa (Citalopram)  Then, stop Prozac, and continue Celexa.  If not feeling well, return in 4-5 days. Otherwise follow up in 1-2 weeks.

## 2016-07-30 NOTE — Progress Notes (Addendum)
Name: Kevin Bernard   MRN: [id number]    DOB: 04-08-1965   Date:07/30/2016       Progress Note  Subjective  Chief Complaint  Chief Complaint  Patient presents with  . Medication Reaction    notice hand and finger trembles while on Prozac. Had been on medication for 2 months    HPI  Pt presents with new onset fine tremor in bilateral hands x1 month. Stopped Zoloft and started Prozac about 2 months ago. No familial tremor. Never used the Ativan that he was prescribed, but keeps it on hand just in case.  Has been doing really well on the Prozac, but is having fine tremor in bilateral hands, and some mental "fog" on the medication and is interested in trying something different.  He has not had a panic attack since his last visit, and has been feeling less anxious.  GERD: Takes Zantac and feels much better with this medication than when he was taking Prilosec. He denies nausea or reflux.  Has been avoiding trigger foods.  Needs Zantac refill today.  Patient Active Problem List   Diagnosis Date Noted  . Panic attack 05/20/2016  . Abnormal EKG 05/18/2016  . Tachycardia 05/18/2016  . Anxiety about health 05/08/2016  . Internal hemorrhoids 12/29/2015  . Back pain, chronic 01/18/2015  . Gastro-esophageal reflux disease without esophagitis 01/18/2015  . Numerous moles 01/18/2015  . Hyperglycemia 01/18/2015  . History of fracture of femur 01/18/2015    Social History  Substance Use Topics  . Smoking status: Former Smoker    Packs/day: 1.00    Years: 2.00    Quit date: 01/30/1988  . Smokeless tobacco: Never Used  . Alcohol use 0.0 oz/week     Current Outpatient Prescriptions:  .  aspirin EC 81 MG tablet, Take 1 tablet (81 mg total) by mouth daily., Disp: 30 tablet, Rfl: 0 .  LORazepam (ATIVAN) 0.5 MG tablet, Take 0.5-1 tablets (0.25-0.5 mg total) by mouth 2 (two) times daily as needed for anxiety., Disp: 12 tablet, Rfl: 0 .  FLUoxetine (PROZAC) 20 MG tablet, Take 1 tablet (20 mg total) by  mouth daily., Disp: 30 tablet, Rfl: 3 .  meclizine (ANTIVERT) 25 MG tablet, Take 1 tablet (25 mg total) by mouth 3 (three) times daily as needed for dizziness. (Patient not taking: Reported on 05/18/2016), Disp: 30 tablet, Rfl: 0 .  Multiple Vitamin (MULTIVITAMINS PO), Take 1 tablet by mouth daily., Disp: , Rfl:  .  potassium chloride (KLOR-CON 10) 10 MEQ tablet, Take 1 tablet (10 mEq total) by mouth 2 (two) times daily. (Patient not taking: Reported on 07/30/2016), Disp: 8 tablet, Rfl: 0 .  ranitidine (ZANTAC) 300 MG tablet, Take 1 tablet (300 mg total) by mouth at bedtime. (Patient not taking: Reported on 07/30/2016), Disp: 30 tablet, Rfl: 3  No Known Allergies  ROS  Ten systems reviewed and is negative except as mentioned in HPI   Objective  Vitals:   07/30/16 1502  BP: 128/84  Pulse: 87  Resp: 16  Temp: 98.2 F (36.8 C)  TempSrc: Oral  SpO2: 98%  Weight: 179 lb 3.2 oz (81.3 kg)  Height: 5' 6"  (1.676 m)    Body mass index is 28.92 kg/m.  Nursing Note and Vital Signs reviewed.  Physical Exam  Constitutional: Patient appears well-developed and well-nourished. Obese No distress.  HEENT: head atraumatic, normocephalic, pupils equal and reactive to light, EOM's intact Cardiovascular: Normal rate, regular rhythm, S1/S2 present.  No murmur or rub heard. No  BLE edema. Pulmonary/Chest: Effort normal and breath sounds clear. No respiratory distress or retractions. Psychiatric: Patient has an moderately anxious mood and affect. behavior is normal. Judgment and thought content normal. Neurological: he is alert and oriented to person, place, and time. No cranial nerve deficit. Coordination, balance, strength, speech and gait are normal. Very slight tremor in bilateral finger tips. Skin: Skin is warm and dry. No rash noted. No erythema.    Recent Results (from the past 2160 hour(s))  CBC     Status: None   Collection Time: 05/12/16 11:12 AM  Result Value Ref Range   WBC 7.6 4.0 - 10.5  K/uL   RBC 5.06 4.22 - 5.81 MIL/uL   Hemoglobin 15.5 13.0 - 17.0 g/dL   HCT 43.5 39.0 - 52.0 %   MCV 86.0 78.0 - 100.0 fL   MCH 30.6 26.0 - 34.0 pg   MCHC 35.6 30.0 - 36.0 g/dL   RDW 12.1 11.5 - 15.5 %   Platelets 213 150 - 400 K/uL  Troponin I     Status: None   Collection Time: 05/12/16 11:12 AM  Result Value Ref Range   Troponin I <0.03 <0.03 ng/mL  Basic metabolic panel     Status: Abnormal   Collection Time: 05/12/16 11:54 AM  Result Value Ref Range   Sodium 138 135 - 145 mmol/L   Potassium 3.2 (L) 3.5 - 5.1 mmol/L   Chloride 108 101 - 111 mmol/L   CO2 22 22 - 32 mmol/L   Glucose, Bld 120 (H) 65 - 99 mg/dL   BUN 18 6 - 20 mg/dL   Creatinine, Ser 0.89 0.61 - 1.24 mg/dL   Calcium 8.9 8.9 - 10.3 mg/dL   GFR calc non Af Amer >60 >60 mL/min   GFR calc Af Amer >60 >60 mL/min    Comment: (NOTE) The eGFR has been calculated using the CKD EPI equation. This calculation has not been validated in all clinical situations. eGFR's persistently <60 mL/min signify possible Chronic Kidney Disease.    Anion gap 8 5 - 15  Hepatic function panel     Status: None   Collection Time: 05/12/16 11:54 AM  Result Value Ref Range   Total Protein 7.0 6.5 - 8.1 g/dL   Albumin 4.3 3.5 - 5.0 g/dL   AST 23 15 - 41 U/L   ALT 24 17 - 63 U/L   Alkaline Phosphatase 73 38 - 126 U/L   Total Bilirubin 0.7 0.3 - 1.2 mg/dL   Bilirubin, Direct 0.1 0.1 - 0.5 mg/dL   Indirect Bilirubin 0.6 0.3 - 0.9 mg/dL  TSH     Status: None   Collection Time: 05/12/16 12:47 PM  Result Value Ref Range   TSH 1.488 0.350 - 4.500 uIU/mL    Comment: Performed by a 3rd Generation assay with a functional sensitivity of <=0.01 uIU/mL. Performed at [facility], [place] [address], [place], [place] [address]      Assessment & Plan  1. Panic attack - citalopram (CELEXA) 10 MG tablet; Take 1 tablet (10 mg total) by mouth daily.  Dispense: 30 tablet; Refill: 0 - Taper and switch from Prozac to Celexa discussed per  AVS.  2. Gastro-esophageal reflux disease without esophagitis - ranitidine (ZANTAC) 300 MG tablet; Take 1 tablet (300 mg total) by mouth at bedtime.  Dispense: 90 tablet; Refill: 1  -Discussed stress reduction, meditation techniques, and sleep hygiene in detail with patient. - Advised follow up in 1 week, but pt  will be on vacation. Return in about 2 weeks (around 08/13/2016) for With Dr. Ancil Boozer PCP.  -Red flags and when to present for emergency care or RTC including fever >101.16F, chest pain, shortness of breath, new/worsening/un-resolving symptoms, reviewed with patient at time of visit. Follow up and care instructions discussed and provided in AVS.  I have reviewed this encounter including the documentation in this note and/or discussed this patient with the Johney Maine, FNP, NP-C. I am certifying that I agree with the content of this note as supervising physician.  Steele Sizer, MD [facility] 08/06/2016, 11:02 AM

## 2016-08-13 ENCOUNTER — Ambulatory Visit: Payer: BLUE CROSS/BLUE SHIELD | Admitting: Family Medicine

## 2016-08-15 ENCOUNTER — Ambulatory Visit (INDEPENDENT_AMBULATORY_CARE_PROVIDER_SITE_OTHER): Payer: BLUE CROSS/BLUE SHIELD | Admitting: Family Medicine

## 2016-08-15 ENCOUNTER — Encounter: Payer: Self-pay | Admitting: Family Medicine

## 2016-08-15 VITALS — BP 118/84 | HR 80 | Temp 98.1°F | Resp 18 | Wt 183.1 lb

## 2016-08-15 DIAGNOSIS — S8992XA Unspecified injury of left lower leg, initial encounter: Secondary | ICD-10-CM

## 2016-08-15 DIAGNOSIS — K219 Gastro-esophageal reflux disease without esophagitis: Secondary | ICD-10-CM | POA: Diagnosis not present

## 2016-08-15 DIAGNOSIS — F411 Generalized anxiety disorder: Secondary | ICD-10-CM | POA: Diagnosis not present

## 2016-08-15 DIAGNOSIS — F410 Panic disorder [episodic paroxysmal anxiety] without agoraphobia: Secondary | ICD-10-CM

## 2016-08-15 MED ORDER — CITALOPRAM HYDROBROMIDE 20 MG PO TABS: 20.0000 mg | ORAL_TABLET | Freq: Every day | ORAL | 0 refills | Status: DC

## 2016-08-15 NOTE — Patient Instructions (Signed)
[facility] [employer].

## 2016-08-15 NOTE — Progress Notes (Signed)
Name: Kevin Bernard   MRN: [id number]    DOB: 1965-03-20   Date:08/15/2016       Progress Note  Subjective  Chief Complaint  Chief Complaint  Patient presents with  . Anxiety    Memory Fogginess improved but anxiety has not been controlled. Raelyn Ensign swinged the patient off of Prozac and put him on Celexa last visit and told him to follow up with PCP in 2 weeks.   . Leg Injury    Left calf hit his leg again bed rail and caused a wound would like it checked out.    HPI  GAD: he tried Zoloft but did not control symptoms, responded to Prozac but it caused tremors, since 07/2016 he has been on Citalopram and is doing well, mental fogginess improved, no longer has tremors, he is feels anxious. He has gone through a lot over the past couple of months. Oldest son lives in [place] and drop out of college, was selling his house but it fell through at the last minute, twins are growing up and will be going to college soon and daughter is transferring to [place]. He states it has affected his marriage, they are very stressed out now. Discussed therapy, mindfulness.   Leg injury: he was at the beach and injured his left lateral ankle on a metal bed frame, it is healing, but he was concerned about infection because he went swimming in the ocean. No oozing or pain right now. Injury happened 10 days ago. No fever or chills.    Patient Active Problem List   Diagnosis Date Noted  . Panic attack 05/20/2016  . Abnormal EKG 05/18/2016  . Tachycardia 05/18/2016  . Anxiety about health 05/08/2016  . Internal hemorrhoids 12/29/2015  . Back pain, chronic 01/18/2015  . Gastro-esophageal reflux disease without esophagitis 01/18/2015  . Numerous moles 01/18/2015  . Hyperglycemia 01/18/2015  . History of fracture of femur 01/18/2015    Past Surgical History:  Procedure Laterality Date  . APPENDECTOMY    . right femur internal fixiation    . VASECTOMY      Family History  Problem Relation Age  of Onset  . CVA Mother   . Cancer Father 75       Pancreatic  . Diabetes Father   . Depression Brother   . Depression Daughter   . Epilepsy Brother     Social History   Social History  . Marital status: Married    Spouse name: Abigail Butts  . Number of children: 4  . 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 Smoking status: Former Smoker    Packs/day: 1.00    Years: 2.00    Quit date: 01/30/1988  . Smokeless tobacco: Never Used  . Alcohol use 0.0 oz/week  . Drug use: No  . Sexual activity: Yes   Other Topics Concern  . Not on file   Social History Narrative   Married, they have 4 children.         Current Outpatient Prescriptions:  .  aspirin EC 81 MG tablet, Take 1 tablet (81 mg total) by mouth daily., Disp: 30 tablet, Rfl: 0 .  citalopram (CELEXA) 10 MG tablet, Take 1 tablet (10 mg total) by mouth daily., Disp: 30 tablet, Rfl: 0 .  LORazepam (ATIVAN) 0.5 MG tablet, Take 0.5-1 tablets (0.25-0.5 mg total) by mouth 2 (two) times daily as needed for anxiety.,  Disp: 12 tablet, Rfl: 0 .  Multiple Vitamin (MULTIVITAMINS PO), Take 1 tablet by mouth daily., Disp: , Rfl:  .  ranitidine (ZANTAC) 300 MG tablet, Take 1 tablet (300 mg total) by mouth at bedtime., Disp: 90 tablet, Rfl: 1  No Known Allergies   ROS  Ten systems reviewed and is negative except as mentioned in HPI   Objective  Vitals:   08/15/16 0835  BP: 118/84  Pulse: 80  Resp: 18  Temp: 98.1 F (36.7 C)  TempSrc: Oral  SpO2: 97%  Weight: 183 lb 1.6 oz (83.1 kg)    Body mass index is 29.55 kg/m.  Physical Exam  Constitutional: Patient appears well-developed and well-nourished. Obese  No distress.  HEENT: head atraumatic, normocephalic, pupils equal and reactive to light,  neck supple, throat within normal limits Cardiovascular: Normal rate, regular rhythm and normal heart sounds.  No murmur heard. No BLE  edema. Pulmonary/Chest: Effort normal and breath sounds normal. No respiratory distress. Abdominal: Soft.  There is no tenderness. Psychiatric: Patient has a normal mood and affect. behavior is normal. Judgment and thought content normal. Skin: 1 inch in size with surrounded erythema, no tenderness during palpation of area, left lateral ankle.   PHQ2/9: Depression screen [facility] 2/9 08/15/2016 05/18/2016 05/07/2016 12/29/2015 01/18/2015  Decreased Interest 0 0 0 0 0  Down, Depressed, Hopeless 0 0 0 0 0  PHQ - 2 Score 0 0 0 0 0   GAD 7 : Generalized Anxiety Score 08/15/2016  Nervous, Anxious, on Edge 2  Control/stop worrying 3  Worry too much - different things 0  Trouble relaxing 1  Restless 2  Easily annoyed or irritable 0  Afraid - awful might happen 0  Total GAD 7 Score 8  Anxiety Difficulty Not difficult at all     Fall Risk: Fall Risk  08/15/2016 05/18/2016 05/07/2016 12/29/2015 01/18/2015  Falls in the past year? No No No No No     Functional Status Survey: Is the patient deaf or have difficulty hearing?: No Does the patient have difficulty seeing, even when wearing glasses/contacts?: No Does the patient have difficulty concentrating, remembering, or making decisions?: No Does the patient have difficulty walking or climbing stairs?: No Does the patient have difficulty dressing or bathing?: No Does the patient have difficulty doing errands alone such as visiting a doctor's office or shopping?: No   Assessment & Plan  1. Panic attack  - citalopram (CELEXA) 20 MG tablet; Take 1 tablet (20 mg total) by mouth daily.  Dispense: 30 tablet; Refill: 0  2. Gastro-esophageal reflux disease without esophagitis  Under control   3. GAD (generalized anxiety disorder)  - citalopram (CELEXA) 20 MG tablet; Take 1 tablet (20 mg total) by mouth daily.  Dispense: 30 tablet; Refill: 0  4. Injury of left lower extremity, initial encounter  Healing, Tdap is up to date

## 2016-09-10 ENCOUNTER — Other Ambulatory Visit: Payer: Self-pay | Admitting: Family Medicine

## 2016-09-10 DIAGNOSIS — F410 Panic disorder [episodic paroxysmal anxiety] without agoraphobia: Secondary | ICD-10-CM

## 2016-09-10 DIAGNOSIS — F411 Generalized anxiety disorder: Secondary | ICD-10-CM

## 2016-09-10 NOTE — Telephone Encounter (Signed)
Patient requesting refill of Celexa to [facility]. 

## 2016-09-26 ENCOUNTER — Ambulatory Visit (INDEPENDENT_AMBULATORY_CARE_PROVIDER_SITE_OTHER): Payer: BLUE CROSS/BLUE SHIELD | Admitting: Family Medicine

## 2016-09-26 ENCOUNTER — Encounter: Payer: Self-pay | Admitting: Family Medicine

## 2016-09-26 VITALS — BP 120/70 | HR 84 | Temp 98.2°F | Resp 18 | Ht 66.0 in | Wt 187.8 lb

## 2016-09-26 DIAGNOSIS — F410 Panic disorder [episodic paroxysmal anxiety] without agoraphobia: Secondary | ICD-10-CM

## 2016-09-26 DIAGNOSIS — F411 Generalized anxiety disorder: Secondary | ICD-10-CM

## 2016-09-26 MED ORDER — CITALOPRAM HYDROBROMIDE 20 MG PO TABS: 20.0000 mg | ORAL_TABLET | Freq: Every day | ORAL | 2 refills | Status: DC

## 2016-09-26 NOTE — Progress Notes (Signed)
Name: Kevin Bernard   MRN: [id number]    DOB: [birth year]   Date:09/26/2016       Progress Note  Subjective  Chief Complaint  Chief Complaint  Patient presents with  . Follow-up  . Medication Refill    HPI   GAD: he tried Zoloft but did not control symptoms, responded to Prozac but it caused tremors, since 07/2016 he has been on Citalopram and is doing well, mental fogginess improved, no longer has tremors, he is feels anxious. He has gone through a lot over the past couple of months. Oldest son lives in [place] and drop out of college, was selling his house but it fell through at the last minute, twins are growing up and will be going to college in 2 years  and daughter is now at [employer]. They are looking for another house, but states more relaxed, more engaged, and able to calm down when he is upset. He states Celexa causes mild delayed ejaculation. No other problems. He is happy with results, continue current regiment follow up in December  Patient Active Problem List   Diagnosis Date Noted  . Panic attack 05/20/2016  . Abnormal EKG 05/18/2016  . Tachycardia 05/18/2016  . Anxiety about health 05/08/2016  . Internal hemorrhoids 12/29/2015  . Back pain, chronic 01/18/2015  . Gastro-esophageal reflux disease without esophagitis 01/18/2015  . Numerous moles 01/18/2015  . Hyperglycemia 01/18/2015  . History of fracture of femur 01/18/2015    Past Surgical History:  Procedure Laterality Date  . APPENDECTOMY    . right femur internal fixiation    . VASECTOMY      Family History  Problem Relation Age of Onset  . CVA Mother   . Cancer Father 9       Pancreatic  . Diabetes Father   . Depression Brother   . Depression Daughter   . Epilepsy Brother     Social History   Social History  . Marital status: Married    Spouse name: Abigail Butts  . Number of children: 4  . Years of education: N/A   Occupational History  . techinal specialist  [employer]  History Main Topics  . Smoking status: Former Smoker    Packs/day: 1.00    Years: 2.00    Quit date: 01/30/1988  . Smokeless tobacco: Never Used  . Alcohol use 0.0 oz/week  . Drug use: No  . Sexual activity: Yes   Other Topics Concern  . Not on file   Social History Narrative   Married, they have 4 children.         Current Outpatient Prescriptions:  .  aspirin EC 81 MG tablet, Take 1 tablet (81 mg total) by mouth daily., Disp: 30 tablet, Rfl: 0 .  citalopram (CELEXA) 20 MG tablet, Take 1 tablet (20 mg total) by mouth daily., Disp: 30 tablet, Rfl: 2 .  LORazepam (ATIVAN) 0.5 MG tablet, Take 0.5-1 tablets (0.25-0.5 mg total) by mouth 2 (two) times daily as needed for anxiety., Disp: 12 tablet, Rfl: 0 .  Multiple Vitamin (MULTIVITAMINS PO), Take 1 tablet by mouth daily., Disp: , Rfl:  .  ranitidine (ZANTAC) 300 MG tablet, Take 1 tablet (300 mg total) by mouth at bedtime., Disp: 90 tablet, Rfl: 1  No Known Allergies   ROS  Ten systems reviewed and is negative except as mentioned in HPI   Objective  Vitals:   09/26/16 0747  BP: 120/70  Pulse: 84  Resp: 18  Temp: 98.2 F (36.8 C)  TempSrc: Oral  SpO2: 96%  Weight: 187 lb 12.8 oz (85.2 kg)  Height: 5\' 6"  (1.676 m)    Body mass index is 30.31 kg/m.  Physical Exam  Constitutional: Patient appears well-developed and well-nourished. Obese  No distress.  HEENT: head atraumatic, normocephalic, pupils equal and reactive to light,  neck supple, throat within normal limits Cardiovascular: Normal rate, regular rhythm and normal heart sounds.  No murmur heard. No BLE edema. Pulmonary/Chest: Effort normal and breath sounds normal. No respiratory distress. Abdominal: Soft.  There is no tenderness. Psychiatric: Patient has a normal mood and affect. behavior is normal. Judgment and thought content normal.  PHQ2/9: Depression screen [facility] 2/9 08/15/2016 05/18/2016 05/07/2016 12/29/2015 01/18/2015  Decreased Interest 0 0 0 0 0  Down,  Depressed, Hopeless 0 0 0 0 0  PHQ - 2 Score 0 0 0 0 0     Fall Risk: Fall Risk  08/15/2016 05/18/2016 05/07/2016 12/29/2015 01/18/2015  Falls in the past year? No No No No No     Assessment & Plan  1. GAD (generalized anxiety disorder)  - citalopram (CELEXA) 20 MG tablet; Take 1 tablet (20 mg total) by mouth daily.  Dispense: 30 tablet; Refill: 2  2. Panic attack  - citalopram (CELEXA) 20 MG tablet; Take 1 tablet (20 mg total) by mouth daily.  Dispense: 30 tablet; Refill: 2

## 2016-10-13 ENCOUNTER — Other Ambulatory Visit: Payer: Self-pay | Admitting: Family Medicine

## 2016-10-13 DIAGNOSIS — F411 Generalized anxiety disorder: Secondary | ICD-10-CM

## 2016-10-13 DIAGNOSIS — F410 Panic disorder [episodic paroxysmal anxiety] without agoraphobia: Secondary | ICD-10-CM

## 2016-12-31 ENCOUNTER — Encounter: Payer: BLUE CROSS/BLUE SHIELD | Admitting: Family Medicine

## 2017-01-13 ENCOUNTER — Other Ambulatory Visit: Payer: Self-pay | Admitting: Family Medicine

## 2017-01-13 DIAGNOSIS — F411 Generalized anxiety disorder: Secondary | ICD-10-CM

## 2017-01-13 DIAGNOSIS — F410 Panic disorder [episodic paroxysmal anxiety] without agoraphobia: Secondary | ICD-10-CM

## 2017-01-14 NOTE — Telephone Encounter (Signed)
Refill request for general medication: Celexa 20 mg  Last office visit: 09/26/2016  Last physical exam: 12/29/2015  Follow up visit; None indicated

## 2017-01-15 ENCOUNTER — Other Ambulatory Visit: Payer: Self-pay

## 2017-01-15 ENCOUNTER — Telehealth: Payer: Self-pay

## 2017-01-15 NOTE — Telephone Encounter (Signed)
Copied from [place] [id number]. Topic: General - Other >> Jan 15, 2017  1:09 PM Lolita Rieger, [place] wrote: Reason for CRM: rescheduled pt CPE for 04/09/17 but pt stated that he will need refills before then Please call pt

## 2017-01-15 NOTE — Telephone Encounter (Signed)
Refill on Celexa is already pending. Waiting on PCP to sign.

## 2017-03-16 ENCOUNTER — Other Ambulatory Visit: Payer: Self-pay | Admitting: Family Medicine

## 2017-04-09 ENCOUNTER — Encounter: Payer: Self-pay | Admitting: Family Medicine

## 2017-04-09 ENCOUNTER — Ambulatory Visit (INDEPENDENT_AMBULATORY_CARE_PROVIDER_SITE_OTHER): Payer: BLUE CROSS/BLUE SHIELD | Admitting: Family Medicine

## 2017-04-09 VITALS — BP 140/100 | HR 91 | Temp 98.1°F | Resp 14 | Ht 66.0 in | Wt 193.8 lb

## 2017-04-09 DIAGNOSIS — K219 Gastro-esophageal reflux disease without esophagitis: Secondary | ICD-10-CM | POA: Diagnosis not present

## 2017-04-09 DIAGNOSIS — F411 Generalized anxiety disorder: Secondary | ICD-10-CM

## 2017-04-09 DIAGNOSIS — Z23 Encounter for immunization: Secondary | ICD-10-CM

## 2017-04-09 DIAGNOSIS — Z13220 Encounter for screening for lipoid disorders: Secondary | ICD-10-CM | POA: Diagnosis not present

## 2017-04-09 DIAGNOSIS — Z131 Encounter for screening for diabetes mellitus: Secondary | ICD-10-CM | POA: Diagnosis not present

## 2017-04-09 DIAGNOSIS — F410 Panic disorder [episodic paroxysmal anxiety] without agoraphobia: Secondary | ICD-10-CM | POA: Diagnosis not present

## 2017-04-09 DIAGNOSIS — Z0001 Encounter for general adult medical examination with abnormal findings: Secondary | ICD-10-CM | POA: Diagnosis not present

## 2017-04-09 DIAGNOSIS — Z0000 Encounter for general adult medical examination without abnormal findings: Secondary | ICD-10-CM

## 2017-04-09 DIAGNOSIS — Z125 Encounter for screening for malignant neoplasm of prostate: Secondary | ICD-10-CM

## 2017-04-09 MED ORDER — CITALOPRAM HYDROBROMIDE 20 MG PO TABS: 20.0000 mg | ORAL_TABLET | Freq: Every day | ORAL | 1 refills | Status: DC

## 2017-04-09 MED ORDER — RANITIDINE HCL 300 MG PO TABS: 300.0000 mg | ORAL_TABLET | Freq: Every day | ORAL | 1 refills | Status: DC

## 2017-04-09 NOTE — Progress Notes (Signed)
Name: Kevin Bernard   MRN: [id number]    DOB: [birth year]   Date:04/09/2017       Progress Note  Subjective  Chief Complaint  Chief Complaint  Patient presents with  . Annual Exam    HPI  Patient presents for annual CPE and follow up   GAD: he tried Zoloft but did not control symptoms, responded to Prozac but it caused tremors, since 07/2016 he has been on Citalopram and is doing well. He was under a lot stress during the Spring 2018, however doing better now. Medication seems to help him cope with stress. They finally sold their house, moving to [place] once they close, he starting to have anxiety about it.   GERD: he is taking Ranitidine daily, states when he has symptoms. He likes to eat spicy food and has flares intermittently  USPSTF grade A and B recommendations:  Diet: eats mostly at home, not much vegetables but has smoothies at home.  Exercise: not very active at this time  Depression:  Depression screen [facility] 2/9 04/09/2017 04/09/2017 08/15/2016 05/18/2016 05/07/2016  Decreased Interest 0 0 0 0 0  Down, Depressed, Hopeless 0 0 0 0 0  PHQ - 2 Score 0 0 0 0 0  Altered sleeping 0 - - - -  Tired, decreased energy 0 - - - -  Change in appetite 0 - - - -  Feeling bad or failure about yourself  0 - - - -  Trouble concentrating 0 - - - -  Moving slowly or fidgety/restless 0 - - - -  Suicidal thoughts 0 - - - -  PHQ-9 Score 0 - - - -  Difficult doing work/chores Not difficult at all - - - -    Hypertension:  BP Readings from Last 3 Encounters:  04/09/17 (!) 140/100  09/26/16 120/70  08/15/16 118/84    Obesity: Wt Readings from Last 3 Encounters:  04/09/17 193 lb 12.8 oz (87.9 kg)  09/26/16 187 lb 12.8 oz (85.2 kg)  08/15/16 183 lb 1.6 oz (83.1 kg)   BMI Readings from Last 3 Encounters:  04/09/17 31.28 kg/m  09/26/16 30.31 kg/m  08/15/16 29.55 kg/m     Lipids:  Lab Results  Component Value Date   CHOL 200 (H) 12/29/2015   CHOL 198 05/06/2013   Lab Results   Component Value Date   HDL 37 (L) 12/29/2015   HDL 42 05/06/2013   Lab Results  Component Value Date   LDLCALC 145 (H) 12/29/2015   LDLCALC 130 05/06/2013   Lab Results  Component Value Date   TRIG 92 12/29/2015   TRIG 130 05/06/2013   Lab Results  Component Value Date   CHOLHDL 5.4 (H) 12/29/2015   No results found for: LDLDIRECT Glucose:  Glucose, Bld  Date Value Ref Range Status  05/12/2016 120 (H) 65 - 99 mg/dL Final  12/29/2015 100 (H) 65 - 99 mg/dL Final      Married STD testing and prevention (chl/gon/syphilis): N/A HIV, hep C: Not interested   Skin cancer: discussed change in moles Colorectal cancer: repeat 2021 Prostate cancer: discussed UsPTF he wants PSA  Lab Results  Component Value Date   PSA 1.7 12/29/2015   PSA 1.7 06/10/2014    IPSS Questionnaire (AUA-7): Over the past month.   1)  How often have you had a sensation of not emptying your bladder completely after you finish urinating?  0 - Not at all  2)  How often have you had to  urinate again less than two hours after you finished urinating? 1 - Less than 1 time in 5  3)  How often have you found you stopped and started again several times when you urinated?  0 - Not at all  4) How difficult have you found it to postpone urination?  0 - Not at all  5) How often have you had a weak urinary stream?  2 - Less than half the time  6) How often have you had to push or strain to begin urination?  1 - Less than 1 time in 5  7) How many times did you most typically get up to urinate from the time you went to bed until the time you got up in the morning?  1 - 1 time  Total score:  0-7 mildly symptomatic   8-19 moderately symptomatic   20-35 severely symptomatic   Lung cancer:   Low Dose CT Chest recommended if Age 55-80 years, 30 pack-year currently smoking OR have quit w/in 15years. Patient does not qualify.   AAA: The USPSTF recommends one-time screening with ultrasonography in men ages 27 to 2 years  who have ever smoked - not due yet  Aspirin: needs to start  ECG:  05/14/2016  Advanced Care Planning: A voluntary discussion about advance care planning including the explanation and discussion of advance directives.  Discussed health care proxy and Living will, and the patient was able to identify a health care proxy as wife  Patient does have a living will at present time. If patient does have living will, I have requested they bring this to the clinic to be scanned in to their chart.  Patient Active Problem List   Diagnosis Date Noted  . Panic attack 05/20/2016  . Abnormal EKG 05/18/2016  . Tachycardia 05/18/2016  . Anxiety about health 05/08/2016  . Internal hemorrhoids 12/29/2015  . Back pain, chronic 01/18/2015  . Gastro-esophageal reflux disease without esophagitis 01/18/2015  . Numerous moles 01/18/2015  . Hyperglycemia 01/18/2015  . History of fracture of femur 01/18/2015    Past Surgical History:  Procedure Laterality Date  . APPENDECTOMY    . right femur internal fixiation    . VASECTOMY      Family History  Problem Relation Age of Onset  . CVA Mother   . Cancer Father 3       Pancreatic  . Diabetes Father   . Depression Brother   . Depression Daughter   . Epilepsy Brother     Social History   Socioeconomic History  . Marital status: Married    Spouse name: Abigail Butts  . Number of children: 4  . Years of education: Not on file  . Highest education level: Some college, no degree  Social Needs  . Financial resource strain: Not hard at all  . Food insecurity - worry: Never true  . Food insecurity - inability: Never true  . Transportation needs - medical: No  . Transportation needs - non-medical: No  Occupational History  . Occupation: techinal specialist     Employer: [employer]  Tobacco Use  . Smoking status: Former Smoker    Packs/day: 1.00    Years: 2.00    Pack years: 2.00    Last attempt to quit: 01/30/1988    Years since quitting: 29.2  .  Smokeless tobacco: Never Used  Substance and Sexual Activity  . Alcohol use: Yes    Alcohol/week: 3.0 oz    Types: 5 Cans of beer  per week  . Drug use: No  . Sexual activity: Yes  Other Topics Concern  . Not on file  Social History Narrative   Married, they have 4 children.      Current Outpatient Medications:  .  aspirin EC 81 MG tablet, Take 1 tablet (81 mg total) by mouth daily., Disp: 30 tablet, Rfl: 0 .  citalopram (CELEXA) 20 MG tablet, Take 1 tablet (20 mg total) by mouth daily., Disp: 90 tablet, Rfl: 1 .  Multiple Vitamin (MULTIVITAMINS PO), Take 1 tablet by mouth daily., Disp: , Rfl:  .  ranitidine (ZANTAC) 300 MG tablet, Take 1 tablet (300 mg total) by mouth at bedtime., Disp: 90 tablet, Rfl: 1  No Known Allergies   ROS   Constitutional: Negative for fever or weight change.  Respiratory: Negative for cough and shortness of breath.   Cardiovascular: Negative for chest pain or palpitations.  Gastrointestinal: Negative for abdominal pain, no bowel changes.  Musculoskeletal: Negative for gait problem or joint swelling.  Skin: Negative for rash.  Neurological: Negative for dizziness or headache.  No other specific complaints in a complete review of systems (except as listed in HPI above).   Objective  Vitals:   04/09/17 1422 04/09/17 1452  BP: 140/82 (!) 140/100  Pulse: 91   Resp: 14   Temp: 98.1 F (36.7 C)   TempSrc: Oral   SpO2: 97%   Weight: 193 lb 12.8 oz (87.9 kg)   Height: 5\' 6"  (1.676 m)     Body mass index is 31.28 kg/m.  Physical Exam  Constitutional: Patient appears well-developed and obese . No distress.  HENT: Head: Normocephalic and atraumatic. Ears: B TMs ok, no erythema or effusion; Nose: Nose normal. Mouth/Throat: Oropharynx is clear and moist. No oropharyngeal exudate.  Eyes: Conjunctivae and EOM are normal. Pupils are equal, round, and reactive to light. No scleral icterus.  Neck: Normal range of motion. Neck supple. No JVD present.  No thyromegaly present.  Cardiovascular: Normal rate, regular rhythm and normal heart sounds.  No murmur heard. No BLE edema. Pulmonary/Chest: Effort normal and breath sounds normal. No respiratory distress. Abdominal: Soft. Bowel sounds are normal, no distension. There is no tenderness. no masses MALE GENITALIA: Normal descended testes bilaterally, no masses palpated, no hernias, no lesions, no discharge RECTAL: Prostate normal size and consistency, no rectal masses but he has a skin tag and internal hemorrhoids Musculoskeletal: Normal range of motion, no joint effusions. No gross deformities Neurological: he is alert and oriented to person, place, and time. No cranial nerve deficit. Coordination, balance, strength, speech and gait are normal.  Skin: Skin is warm and dry. No rash noted. No erythema.  Psychiatric: Patient has a normal mood and affect. behavior is normal. Judgment and thought content normal.    PHQ2/9: Depression screen [facility] 2/9 04/09/2017 04/09/2017 08/15/2016 05/18/2016 05/07/2016  Decreased Interest 0 0 0 0 0  Down, Depressed, Hopeless 0 0 0 0 0  PHQ - 2 Score 0 0 0 0 0  Altered sleeping 0 - - - -  Tired, decreased energy 0 - - - -  Change in appetite 0 - - - -  Feeling bad or failure about yourself  0 - - - -  Trouble concentrating 0 - - - -  Moving slowly or fidgety/restless 0 - - - -  Suicidal thoughts 0 - - - -  PHQ-9 Score 0 - - - -  Difficult doing work/chores Not difficult at all - - - -  GAD 7 : Generalized Anxiety Score 04/09/2017 08/15/2016  Nervous, Anxious, on Edge 1 2  Control/stop worrying 0 3  Worry too much - different things 1 0  Trouble relaxing 1 1  Restless 3 2  Easily annoyed or irritable 0 0  Afraid - awful might happen 0 0  Total GAD 7 Score 6 8  Anxiety Difficulty Somewhat difficult Not difficult at all      Fall Risk: Fall Risk  04/09/2017 08/15/2016 05/18/2016 05/07/2016 12/29/2015  Falls in the past year? No No No No No     Functional  Status Survey: Is the patient deaf or have difficulty hearing?: No Does the patient have difficulty seeing, even when wearing glasses/contacts?: No Does the patient have difficulty concentrating, remembering, or making decisions?: No Does the patient have difficulty walking or climbing stairs?: No Does the patient have difficulty dressing or bathing?: No Does the patient have difficulty doing errands alone such as visiting a doctor's office or shopping?: No   Assessment & Plan  1. Encounter for routine history and physical exam for male  Discussed importance of 150 minutes of physical activity weekly, eat two servings of fish weekly, eat one serving of tree nuts ( cashews, pistachios, pecans, almonds.Marland Kitchen) every other day, eat 6 servings of fruit/vegetables daily and drink plenty of water and avoid sweet beverages.  - Lipid panel - Hemoglobin A1c - COMPLETE METABOLIC PANEL WITH GFR - CBC with Differential/Platelet  2. Flu vaccine need  - Flu Vaccine QUAD 36+ mos IM  3. Gastro-esophageal reflux disease without esophagitis  - ranitidine (ZANTAC) 300 MG tablet; Take 1 tablet (300 mg total) by mouth at bedtime.  Dispense: 90 tablet; Refill: 1  4. GAD (generalized anxiety disorder)  - citalopram (CELEXA) 20 MG tablet; Take 1 tablet (20 mg total) by mouth daily.  Dispense: 90 tablet; Refill: 1  5. Panic attack  - citalopram (CELEXA) 20 MG tablet; Take 1 tablet (20 mg total) by mouth daily.  Dispense: 90 tablet; Refill: 1  6. Prostate cancer screening  - PSA  7. Lipid screening  - Lipid panel  8. Diabetes mellitus screening  - Hemoglobin A1c

## 2017-04-09 NOTE — Patient Instructions (Signed)

## 2017-04-10 LAB — COMPLETE METABOLIC PANEL WITH GFR
AG RATIO: 2.3 (calc) (ref 1.0–2.5)
ALKALINE PHOSPHATASE (APISO): 78 U/L (ref 40–115)
ALT: 25 U/L (ref 9–46)
AST: 25 U/L (ref 10–35)
Albumin: 4.4 g/dL (ref 3.6–5.1)
BILIRUBIN TOTAL: 0.5 mg/dL (ref 0.2–1.2)
BUN/Creatinine Ratio: 15 (calc) (ref 6–22)
BUN: 21 mg/dL (ref 7–25)
CHLORIDE: 109 mmol/L (ref 98–110)
CO2: 28 mmol/L (ref 20–32)
Calcium: 8.8 mg/dL (ref 8.6–10.3)
Creat: 1.36 mg/dL — ABNORMAL HIGH (ref 0.70–1.33)
GFR, EST AFRICAN AMERICAN: 69 mL/min/{1.73_m2} (ref >=60)
GFR, Est Non African American: 60 mL/min/{1.73_m2} (ref >=60)
GLUCOSE: 96 mg/dL (ref 65–139)
Globulin: 1.9 g/dL (calc) (ref 1.9–3.7)
POTASSIUM: 4.1 mmol/L (ref 3.5–5.3)
Sodium: 143 mmol/L (ref 135–146)
Total Protein: 6.3 g/dL (ref 6.1–8.1)

## 2017-04-10 LAB — LIPID PANEL
CHOL/HDL RATIO: 4.8 (calc) (ref <5.0)
Cholesterol: 191 mg/dL (ref <200)
HDL: 40 mg/dL — ABNORMAL LOW (ref >40)
LDL Cholesterol (Calc): 121 mg/dL (calc) — ABNORMAL HIGH
NON-HDL CHOLESTEROL (CALC): 151 mg/dL — AB (ref <130)
Triglycerides: 183 mg/dL — ABNORMAL HIGH (ref <150)

## 2017-04-10 LAB — CBC WITH DIFFERENTIAL/PLATELET
BASOS ABS: 50 {cells}/uL (ref 0–200)
BASOS PCT: 0.9 %
EOS ABS: 72 {cells}/uL (ref 15–500)
Eosinophils Relative: 1.3 %
HCT: 42.8 % (ref 38.5–50.0)
HEMOGLOBIN: 15 g/dL (ref 13.2–17.1)
Lymphs Abs: 1755 cells/uL (ref 850–3900)
MCH: 30.4 pg (ref 27.0–33.0)
MCHC: 35 g/dL (ref 32.0–36.0)
MCV: 86.6 fL (ref 80.0–100.0)
MPV: 11.3 fL (ref 7.5–12.5)
Monocytes Relative: 10 %
NEUTROS ABS: 3075 {cells}/uL (ref 1500–7800)
Neutrophils Relative %: 55.9 %
Platelets: 204 10*3/uL (ref 140–400)
RBC: 4.94 10*6/uL (ref 4.20–5.80)
RDW: 12 % (ref 11.0–15.0)
Total Lymphocyte: 31.9 %
WBC: 5.5 10*3/uL (ref 3.8–10.8)
WBCMIX: 550 {cells}/uL (ref 200–950)

## 2017-04-10 LAB — PSA: PSA: 1.3 ng/mL (ref <=4.0)

## 2017-04-10 LAB — HEMOGLOBIN A1C
EAG (MMOL/L): 6.3 (calc)
HEMOGLOBIN A1C: 5.6 %{Hb} (ref <5.7)
Mean Plasma Glucose: 114 (calc)

## 2017-09-11 LAB — LIPID PANEL
CHOLESTEROL: 201 — AB (ref 0–200)
HDL: 30 — AB (ref 35–70)
LDL CALC: 148
LDl/HDL Ratio: 4.9
TRIGLYCERIDES: 114 (ref 40–160)

## 2017-09-11 LAB — BASIC METABOLIC PANEL: Glucose: 95

## 2017-10-11 ENCOUNTER — Encounter: Payer: Self-pay | Admitting: Family Medicine

## 2017-10-11 ENCOUNTER — Ambulatory Visit: Payer: BLUE CROSS/BLUE SHIELD | Admitting: Family Medicine

## 2017-10-11 VITALS — BP 144/96 | HR 69 | Temp 98.3°F | Resp 14 | Ht 66.0 in | Wt 193.6 lb

## 2017-10-11 DIAGNOSIS — M7712 Lateral epicondylitis, left elbow: Secondary | ICD-10-CM

## 2017-10-11 DIAGNOSIS — F411 Generalized anxiety disorder: Secondary | ICD-10-CM | POA: Diagnosis not present

## 2017-10-11 DIAGNOSIS — Z23 Encounter for immunization: Secondary | ICD-10-CM

## 2017-10-11 DIAGNOSIS — I10 Essential (primary) hypertension: Secondary | ICD-10-CM

## 2017-10-11 DIAGNOSIS — E785 Hyperlipidemia, unspecified: Secondary | ICD-10-CM

## 2017-10-11 DIAGNOSIS — K219 Gastro-esophageal reflux disease without esophagitis: Secondary | ICD-10-CM | POA: Diagnosis not present

## 2017-10-11 DIAGNOSIS — M7711 Lateral epicondylitis, right elbow: Secondary | ICD-10-CM

## 2017-10-11 DIAGNOSIS — F410 Panic disorder [episodic paroxysmal anxiety] without agoraphobia: Secondary | ICD-10-CM | POA: Diagnosis not present

## 2017-10-11 MED ORDER — CITALOPRAM HYDROBROMIDE 20 MG PO TABS: 20.0000 mg | ORAL_TABLET | Freq: Every day | ORAL | 1 refills | Status: DC

## 2017-10-11 MED ORDER — RANITIDINE HCL 300 MG PO TABS: 300.0000 mg | ORAL_TABLET | Freq: Every day | ORAL | 1 refills | Status: DC

## 2017-10-11 MED ORDER — LISINOPRIL 10 MG PO TABS
10.0000 mg | ORAL_TABLET | Freq: Every day | ORAL | 0 refills | Status: DC
Start: 2017-10-11 — End: 2017-11-03

## 2017-10-11 NOTE — Patient Instructions (Signed)
Tennis Elbow Tennis elbow (lateral epicondylitis) is inflammation of the outer tendons of your forearm close to your elbow. Your tendons attach your muscles to your bones. The outer tendons of your forearm are used to extend your wrist, and they attach on the outside part of your elbow. Tennis elbow is often found in people who play tennis, but anyone may get the condition from repeatedly extending the wrist or turning the forearm. What are the causes? This condition is caused by repeatedly extending your wrist and using your hands. It can result from sports or work that requires repetitive forearm movements. Tennis elbow may also be caused by an injury. What increases the risk? You have a higher risk of developing tennis elbow if you play tennis or another racquet sport. You also have a higher risk if you frequently use your hands for work. This condition is also more likely to develop in:  Musicians.  Carpenters, painters, and plumbers.  Cooks.  Cashiers.  People who work in factories.  Construction workers.  Butchers.  People who use computers.  What are the signs or symptoms? Symptoms of this condition include:  Pain and tenderness in your forearm and the outer part of your elbow. You may only feel the pain when you use your arm, or you may feel it even when you are not using your arm.  A burning feeling that runs from your elbow through your arm.  Weak grip in your hands.  How is this diagnosed? This condition may be diagnosed by medical history and physical exam. You may also have other tests, including:  X-rays.  MRI.  How is this treated? Your health care provider will recommend lifestyle adjustments, such as resting and icing your arm. Treatment may also include:  Medicines for inflammation. This may include shots of cortisone if your pain continues.  Physical therapy. This may include massage or exercises.  An elbow brace.  Surgery may eventually be  recommended if your pain does not go away with treatment. Follow these instructions at home: Activity  Rest your elbow and wrist as directed by your health care provider. Try to avoid any activities that caused the problem until your health care provider says that you can do them again.  If a physical therapist teaches you exercises, do all of them as directed.  If you lift an object, lift it with your palm facing upward. This lowers the stress on your elbow. Lifestyle  If your tennis elbow is caused by sports, check your equipment and make sure that: ? You are using it correctly. ? It is the best fit for you.  If your tennis elbow is caused by work, take breaks frequently, if you are able. Talk with your manager about how to best perform tasks in a way that is safe. ? If your tennis elbow is caused by computer use, talk with your manager about any changes that can be made to your work environment. General instructions  If directed, apply ice to the painful area: ? Put ice in a plastic bag. ? Place a towel between your skin and the bag. ? Leave the ice on for 20 minutes, 2-3 times per day.  Take medicines only as directed by your health care provider.  If you were given a brace, wear it as directed by your health care provider.  Keep all follow-up visits as directed by your health care provider. This is important. Contact a health care provider if:  Your pain does not   get better with treatment.  Your pain gets worse.  You have numbness or weakness in your forearm, hand, or fingers. This information is not intended to replace advice given to you by your health care provider. Make sure you discuss any questions you have with your health care provider. Document Released: 01/15/2005 Document Revised: 09/15/2015 Document Reviewed: 01/11/2014 Elsevier Interactive Patient Education  2018 Elsevier Inc.  

## 2017-10-11 NOTE — Progress Notes (Signed)
Name: Kevin Bernard   MRN: [id number]    DOB: 12-May-1965   Date:10/11/2017       Progress Note  Subjective  Chief Complaint  Chief Complaint  Patient presents with  . Follow-up    patient is here for his 6 month f/u  . Gastroesophageal Reflux  . Anxiety  . Stress    currently he is under a lot of stress - work & financial related  . Carpal Tunnel    bilateral    HPI   HTN: he states his bp was elevated on his last visit, also at his work - during a well screening. He states under more stress, building a house and there is always something going on . He denies chest pain or palpitation.   Dyslipidemia: HDL dropped to 30 at work, discussed life style modification  Tendinitis: he states he has pain on both elbows and forearms for a long time, but getting much worse, he tried otc brace without help. He has been more active with house construction and likely the cause of worsening os symptoms.   GAD: he tried Zoloft but did not control symptoms, responded to Prozac but it caused tremors, since 07/2016 he has been on Citalopram and is doing well. He is very stressed with house construction, feeling more tired than usual and GAD is up to 10. His phq9 is 4 mostly because feeling tired. He states that his stress could be much worse if he was not on medication. He has mild sexual side effects, with lack of sensation but still able to reach an orgasm. He wants to continue medication . He states he should feel better when he closes on the house.   GERD: he is taking Ranitidine daily, and symptoms under control.  He likes to eat spicy food and has flares intermittently but not very often with medication   The 10-year ASCVD risk score Mikey Bussing DC Brooke Bonito., et al., 2013) is: 6.5%   Values used to calculate the score:     Age: 52 years     Sex: Male     Is Non-Hispanic African American: No     Diabetic: No     Tobacco smoker: No     Systolic Blood Pressure: 443 mmHg     Is BP treated: Yes     HDL  Cholesterol: 40 mg/dL     Total Cholesterol: 191 mg/dL    Patient Active Problem List   Diagnosis Date Noted  . Panic attack 05/20/2016  . Abnormal EKG 05/18/2016  . Tachycardia 05/18/2016  . Anxiety about health 05/08/2016  . Internal hemorrhoids 12/29/2015  . Back pain, chronic 01/18/2015  . Gastro-esophageal reflux disease without esophagitis 01/18/2015  . Numerous moles 01/18/2015  . Hyperglycemia 01/18/2015  . History of fracture of femur 01/18/2015    Past Surgical History:  Procedure Laterality Date  . APPENDECTOMY    . right femur internal fixiation    . VASECTOMY      Family History  Problem Relation Age of Onset  . CVA Mother   . Cancer Father 35       Pancreatic  . Diabetes Father   . Depression Brother   . Depression Daughter   . Epilepsy Brother     Social History   Socioeconomic History  . Marital status: Married    Spouse name: Abigail Butts  . Number of children: 4  . Years of education: Not on file  . Highest education level: Some college, no degree  Occupational History  . Occupation: techinal specialist     Employer: [employer]  . Financial resource strain: Not hard at all  . Food insecurity:    Worry: Never true    Inability: Never true  . Transportation needs:    Medical: No    Non-medical: No  Tobacco Use  . Smoking status: Former Smoker    Packs/day: 1.00    Years: 2.00    Pack years: 2.00    Last attempt to quit: 01/30/1988    Years since quitting: 29.7  . Smokeless tobacco: Never Used  Substance and Sexual Activity  . Alcohol use: Yes    Alcohol/week: 5.0 standard drinks    Types: 5 Cans of beer per week  . Drug use: No  . Sexual activity: Yes    Partners: Female    Birth control/protection: None  Lifestyle  . Physical activity:    Days per week: 2 days    Minutes per session: Not on file  . Stress: Only a little  Relationships  . Social connections:    Talks on phone: More than three times a week    Gets  together: More than three times a week    Attends religious service: More than 4 times per year    Active member of club or organization: No    Attends meetings of clubs or organizations: Never    Relationship status: Married  . Intimate partner violence:    Fear of current or ex partner: No    Emotionally abused: No    Physically abused: No    Forced sexual activity: No  Other Topics Concern  . Not on file  Social History Narrative   Married, they have 4 children.      Current Outpatient Medications:  .  aspirin EC 81 MG tablet, Take 1 tablet (81 mg total) by mouth daily., Disp: 30 tablet, Rfl: 0 .  citalopram (CELEXA) 20 MG tablet, Take 1 tablet (20 mg total) by mouth daily., Disp: 90 tablet, Rfl: 1 .  Multiple Vitamin (MULTIVITAMINS PO), Take 1 tablet by mouth daily., Disp: , Rfl:  .  ranitidine (ZANTAC) 300 MG tablet, Take 1 tablet (300 mg total) by mouth at bedtime., Disp: 90 tablet, Rfl: 1 .  lisinopril (PRINIVIL,ZESTRIL) 10 MG tablet, Take 1 tablet (10 mg total) by mouth daily., Disp: 30 tablet, Rfl: 0  No Known Allergies  I personally reviewed active problem list, medication list, allergies, family history, social history with the patient/caregiver today.   ROS  Constitutional: Negative for fever or weight change.  Respiratory: Negative for cough and shortness of breath.   Cardiovascular: Negative for chest pain or palpitations.  Gastrointestinal: Negative for abdominal pain, no bowel changes.  Musculoskeletal: Negative for gait problem or joint swelling.  Skin: Negative for rash.  Neurological: Negative for dizziness or headache.  No other specific complaints in a complete review of systems (except as listed in HPI above).  Objective  Vitals:   10/11/17 1446 10/11/17 1453  BP: (!) 146/98 (!) 144/96  Pulse: 69   Resp: 14   Temp: 98.3 F (36.8 C)   TempSrc: Oral   SpO2: 99%   Weight: 193 lb 9.6 oz (87.8 kg)   Height: 5\' 6"  (1.676 m)     Body mass index is  31.25 kg/m.  Physical Exam  Constitutional: Patient appears well-developed and well-nourished. Obese  No distress.  HEENT: head atraumatic, normocephalic, pupils equal and reactive to light, neck  supple, throat within normal limits Cardiovascular: Normal rate, regular rhythm and normal heart sounds.  No murmur heard. No BLE edema. Pulmonary/Chest: Effort normal and breath sounds normal. No respiratory distress. Abdominal: Soft.  There is no tenderness. Muscular skeletal: pain during palpation of both lateral epicondyles.  Psychiatric: Patient has a normal mood and affect. behavior is normal. Judgment and thought content normal.   GAD 7 : Generalized Anxiety Score 10/11/2017 04/09/2017 08/15/2016  Nervous, Anxious, on Edge 3 1 2   Control/stop worrying 3 0 3  Worry too much - different things 3 1 0  Trouble relaxing 1 1 1   Restless 0 3 2  Easily annoyed or irritable 0 0 0  Afraid - awful might happen 0 0 0  Total GAD 7 Score 10 6 8   Anxiety Difficulty - Somewhat difficult Not difficult at all     PHQ2/9: Depression screen [facility] 2/9 10/11/2017 04/09/2017 04/09/2017 08/15/2016 05/18/2016  Decreased Interest 0 0 0 0 0  Down, Depressed, Hopeless 0 0 0 0 0  PHQ - 2 Score 0 0 0 0 0  Altered sleeping 2 0 - - -  Tired, decreased energy 2 0 - - -  Change in appetite 0 0 - - -  Feeling bad or failure about yourself  0 0 - - -  Trouble concentrating 0 0 - - -  Moving slowly or fidgety/restless 0 0 - - -  Suicidal thoughts 0 0 - - -  PHQ-9 Score 4 0 - - -  Difficult doing work/chores Not difficult at all Not difficult at all - - -     Fall Risk: Fall Risk  10/11/2017 04/09/2017 08/15/2016 05/18/2016 05/07/2016  Falls in the past year? No No No No No     Functional Status Survey: Is the patient deaf or have difficulty hearing?: No Does the patient have difficulty seeing, even when wearing glasses/contacts?: No Does the patient have difficulty concentrating, remembering, or making decisions?:  No Does the patient have difficulty walking or climbing stairs?: No Does the patient have difficulty dressing or bathing?: No Does the patient have difficulty doing errands alone such as visiting a doctor's office or shopping?: No   Assessment & Plan  1. Hypertension, benign  - lisinopril (PRINIVIL,ZESTRIL) 10 MG tablet; Take 1 tablet (10 mg total) by mouth daily.  Dispense: 30 tablet; Refill: 0 - BASIC METABOLIC PANEL WITH GFR  He will get bp at work and he will send me the readings and we will send refill for 6 months if bp is at goal or we can adjust dose  2. Panic attack  - citalopram (CELEXA) 20 MG tablet; Take 1 tablet (20 mg total) by mouth daily.  Dispense: 90 tablet; Refill: 1  3. GAD (generalized anxiety disorder)  - citalopram (CELEXA) 20 MG tablet; Take 1 tablet (20 mg total) by mouth daily.  Dispense: 90 tablet; Refill: 1  4. Gastro-esophageal reflux disease without esophagitis  - ranitidine (ZANTAC) 300 MG tablet; Take 1 tablet (300 mg total) by mouth at bedtime.  Dispense: 90 tablet; Refill: 1  5. Flu vaccine need  He will get it at work   6. Dyslipidemia  Discussed life style modification again, refuses statin therapy   7. 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

## 2017-11-03 ENCOUNTER — Other Ambulatory Visit: Payer: Self-pay | Admitting: Family Medicine

## 2017-11-03 DIAGNOSIS — I10 Essential (primary) hypertension: Secondary | ICD-10-CM

## 2017-11-03 NOTE — Telephone Encounter (Signed)
Please ask his BP readings from work. I can send a refill for 6 months, but I need to know if the dose of lisinopril is correct.  He also needs to have basic metabolic panel done to monitor his potassium and kidney function since we started new therapy. The order was placed on his last visit.

## 2017-11-04 NOTE — Telephone Encounter (Signed)
I tried to contact this patient to get the requested information (BP readings) but the call stated that the number was unavailable. I was not able to leave a message.

## 2017-11-04 NOTE — Telephone Encounter (Signed)
Patient's wife called and advised we are trying to reach the patient to give information from Dr. Ancil Boozer, she gave his new cell number and it was updated in the chart. Patient called and advised of the below note from Dr. Ancil Boozer. He says he forgot about getting the BP readings and will get the paperwork with his BP readings done tomorrow. I advised to stop by the lab at the office for blood work at his earliest convenience, he verbalized understanding.

## 2017-11-05 ENCOUNTER — Ambulatory Visit: Payer: BLUE CROSS/BLUE SHIELD

## 2017-11-05 VITALS — BP 136/84 | HR 70

## 2017-11-05 DIAGNOSIS — R9431 Abnormal electrocardiogram [ECG] [EKG]: Secondary | ICD-10-CM

## 2017-11-05 DIAGNOSIS — R000 Tachycardia, unspecified: Secondary | ICD-10-CM

## 2017-11-05 DIAGNOSIS — F418 Other specified anxiety disorders: Secondary | ICD-10-CM

## 2017-11-05 DIAGNOSIS — I10 Essential (primary) hypertension: Secondary | ICD-10-CM

## 2017-11-05 MED ORDER — LISINOPRIL 10 MG PO TABS: 10.0000 mg | ORAL_TABLET | Freq: Every day | ORAL | 1 refills | Status: DC

## 2017-11-05 NOTE — Telephone Encounter (Signed)
Please review message from [place]

## 2017-11-05 NOTE — Addendum Note (Signed)
Addended by: Inda Coke on: 11/05/2017 04:26 PM   Modules accepted: Orders

## 2017-11-05 NOTE — Progress Notes (Signed)
Patient is here for a blood pressure check. Patient denies chest pain, palpitations, shortness of breath or visual disturbances. At previous visit blood pressure was 144/96 with a heart rate of 69. Today during nurse visit first check blood pressure was 136/84 and heart rate was 70. He does take any blood pressure medications.  We will continue HCTZ , I will send new rx for 90 day supply and one refill

## 2017-11-07 DIAGNOSIS — F411 Generalized anxiety disorder: Secondary | ICD-10-CM | POA: Diagnosis not present

## 2017-11-07 DIAGNOSIS — E785 Hyperlipidemia, unspecified: Secondary | ICD-10-CM | POA: Diagnosis not present

## 2017-11-07 DIAGNOSIS — I10 Essential (primary) hypertension: Secondary | ICD-10-CM | POA: Diagnosis not present

## 2017-11-08 LAB — BASIC METABOLIC PANEL
BUN / CREAT RATIO: 19 (ref 9–20)
BUN: 19 mg/dL (ref 6–24)
CO2: 25 mmol/L (ref 20–29)
CREATININE: 0.98 mg/dL (ref 0.76–1.27)
Calcium: 9.5 mg/dL (ref 8.7–10.2)
Chloride: 102 mmol/L (ref 96–106)
GFR calc non Af Amer: 89 mL/min/{1.73_m2} (ref >59)
GFR, EST AFRICAN AMERICAN: 103 mL/min/{1.73_m2} (ref >59)
GLUCOSE: 84 mg/dL (ref 65–99)
Potassium: 4.3 mmol/L (ref 3.5–5.2)
SODIUM: 142 mmol/L (ref 134–144)

## 2017-12-31 IMAGING — CT CT HEAD W/O CM
3 series · 15 of 47 positions shown, 18 images · non-contrast
Comparison: None.

CLINICAL DATA: Hx multiple syncopal episodes in past, some nausea,
having heart palpitations x several weeks with sob

EXAM:
CT HEAD WITHOUT CONTRAST
TECHNIQUE: Contiguous axial images were obtained from the base of the skull
through the vertex without intravenous contrast.

[Series 2: head wo · axial · 0.48mm/px · z∈[-144,-19]mm · 9 of 30 slices shown, 12 images]
[im 3/30  brain]
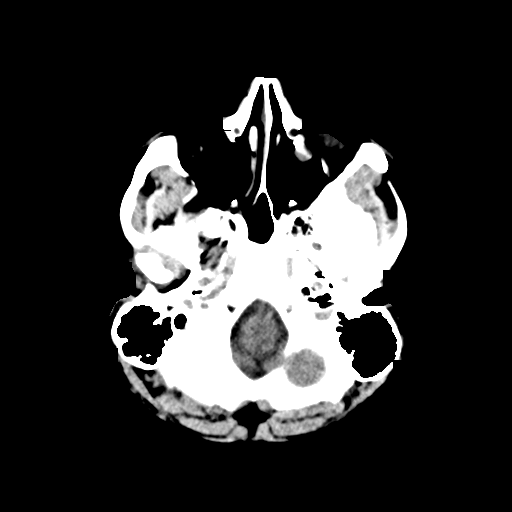
[im 3/30  bone]
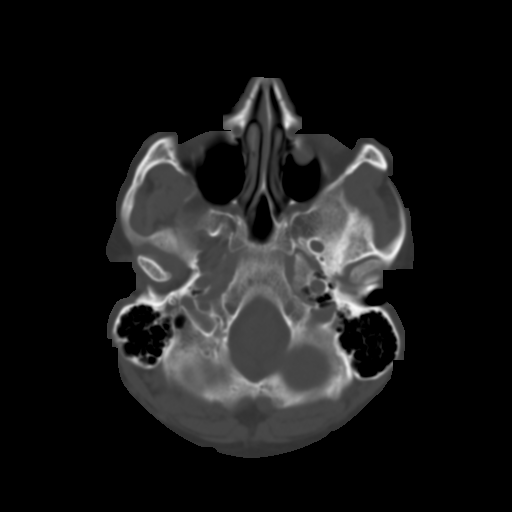
[im 6/30  brain]
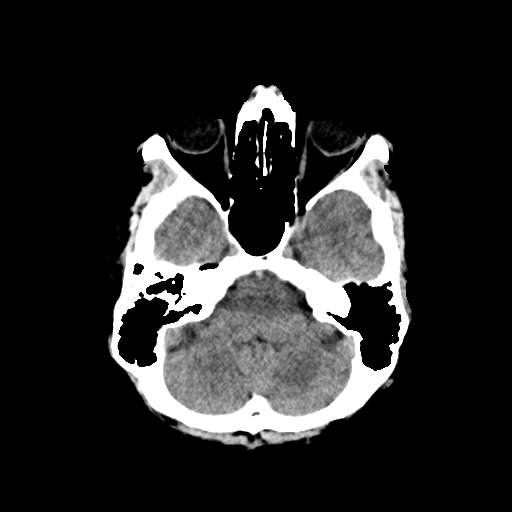
[im 9/30  brain]
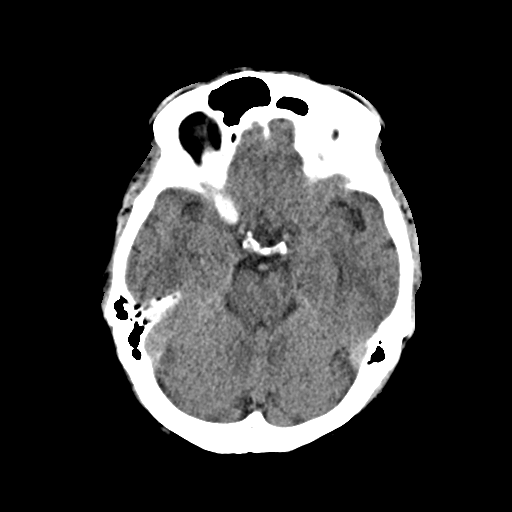
[im 12/30  brain]
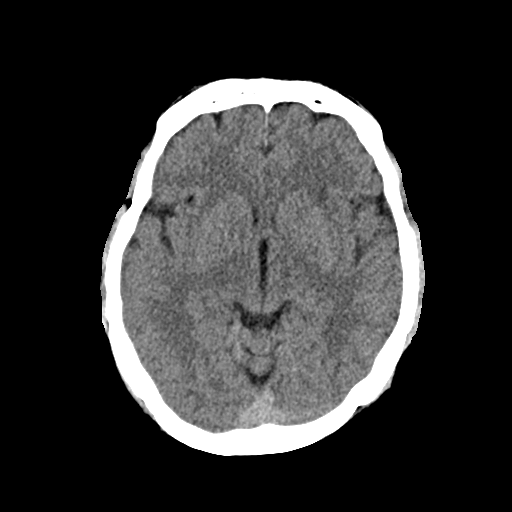
[im 16/30  brain]
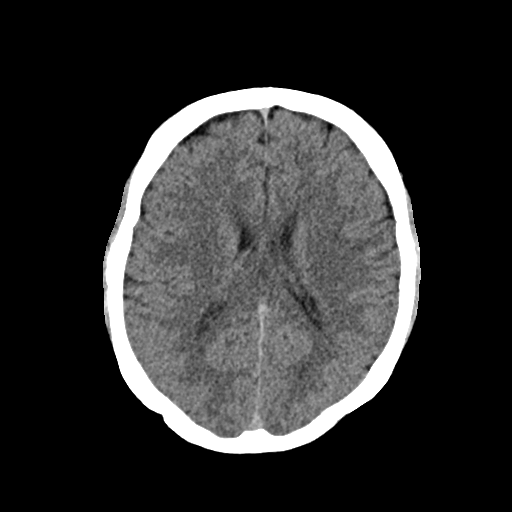
[im 16/30  bone]
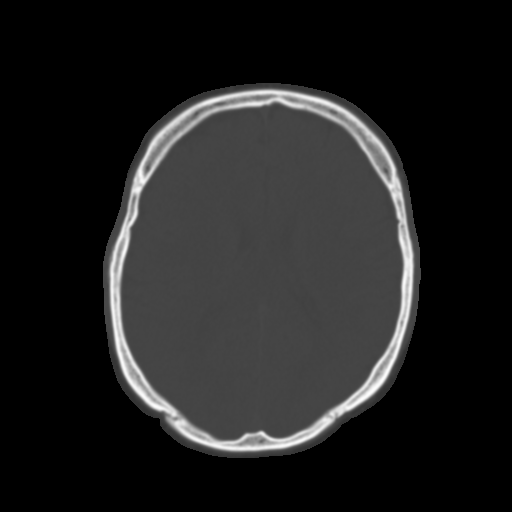
[im 19/30  brain]
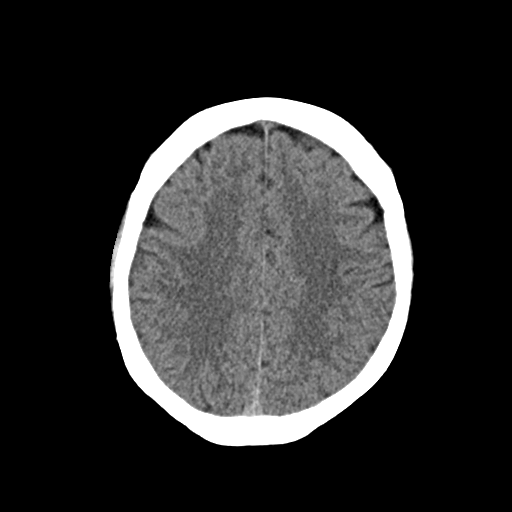
[im 22/30  brain]
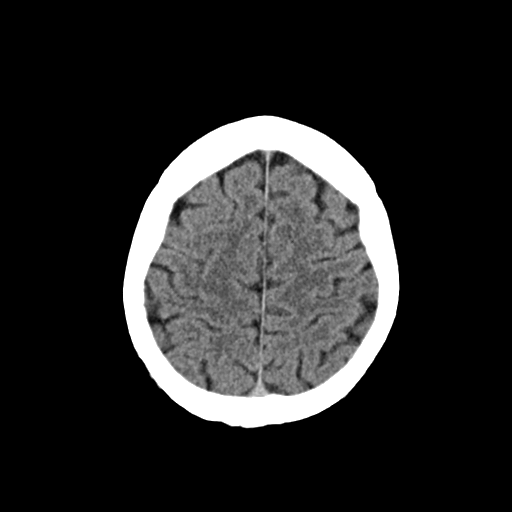
[im 25/30  brain]
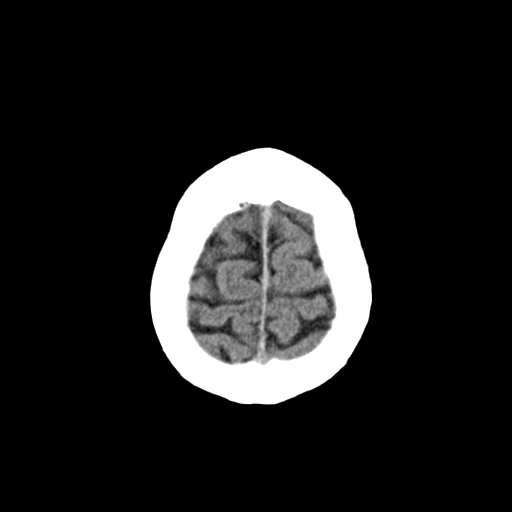
[im 28/30  brain]
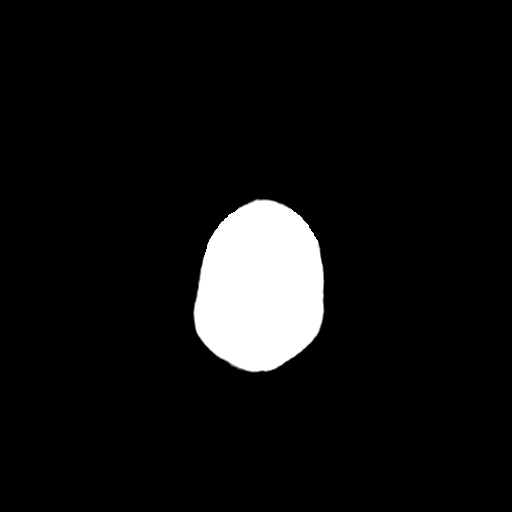
[im 28/30  bone]
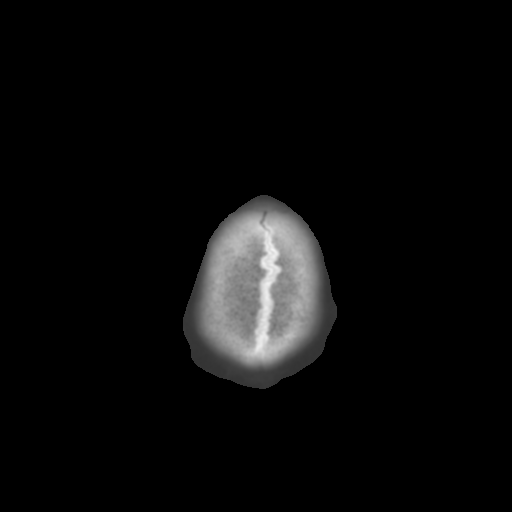

[Series 4: coronal soft · coronal · 0.30mm/px · 3 of 68 slices shown]
[im 23/68  brain]
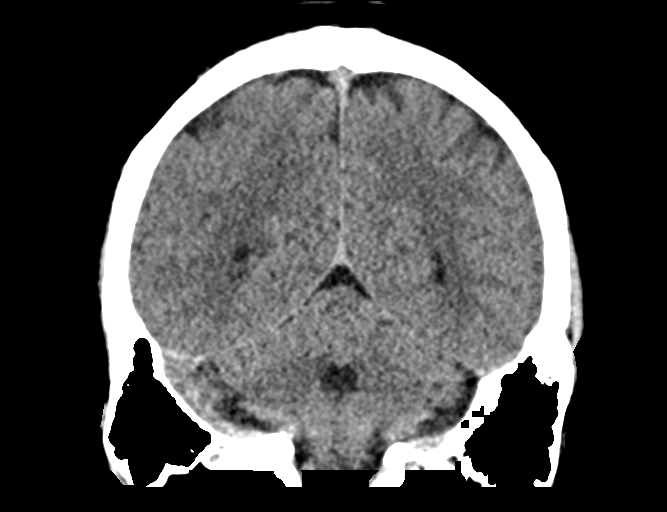
[im 30/68  brain]
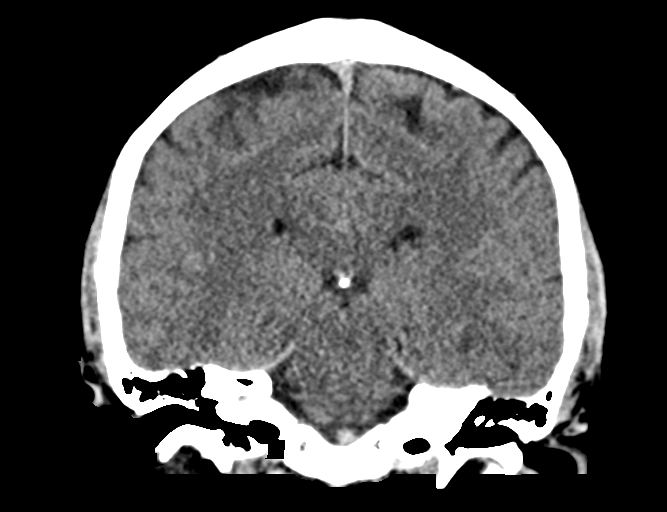
[im 38/68  brain]
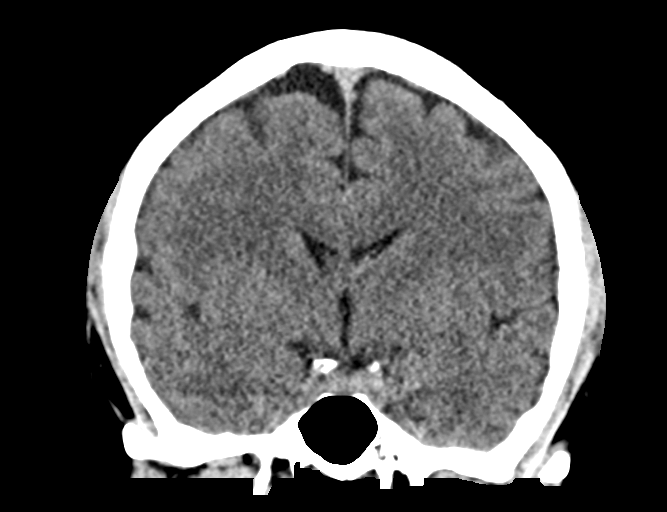

[Series 5: sag soft · sagittal · 0.34mm/px · 3 of 57 slices shown]
[im 19/57  brain]
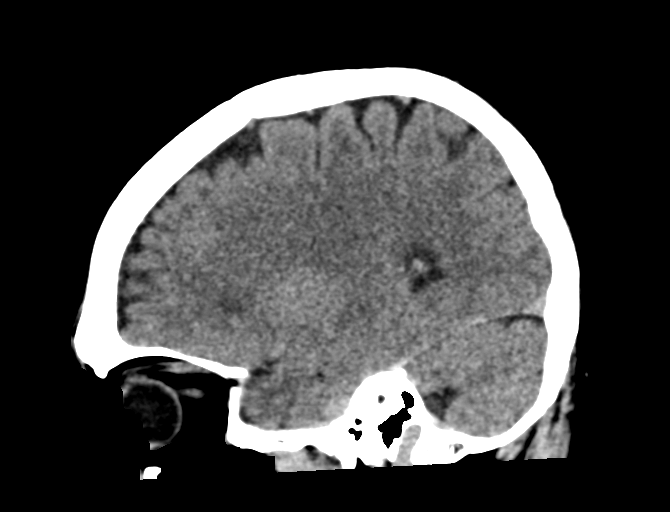
[im 29/57  brain]
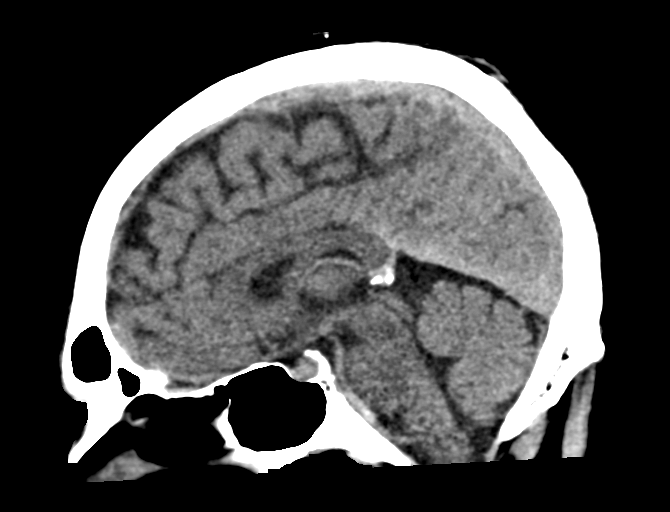
[im 38/57  brain]
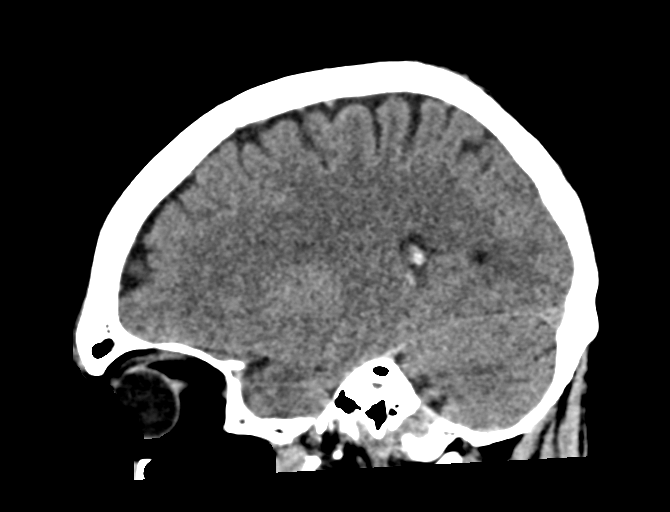

[15 of 47 positions shown; findings below may reference images not displayed]

FINDINGS: Brain: No acute intracranial hemorrhage. No focal mass lesion. No CT
evidence of acute infarction. No midline shift or mass effect. No
hydrocephalus. Basilar cisterns are patent.

Mild white matter hypodensities in the anterior limb of the LEFT
external capsule (image 12 series 1)

Vascular: No hyperdense vessel or unexpected calcification.

Skull: Normal. Negative for fracture or focal lesion.

Sinuses/Orbits: No acute finding.

Other: None.
IMPRESSION: 1. No acute intracranial findings.
2. Minimal white matter microvascular disease.

## 2017-12-31 IMAGING — CR DG CHEST 2V
2 series · 2 of 2 positions shown · non-contrast
Comparison: None.

CLINICAL DATA: Shortness of breath and palpitations for several
weeks.

EXAM:
CHEST  2 VIEW

[w chest pa]
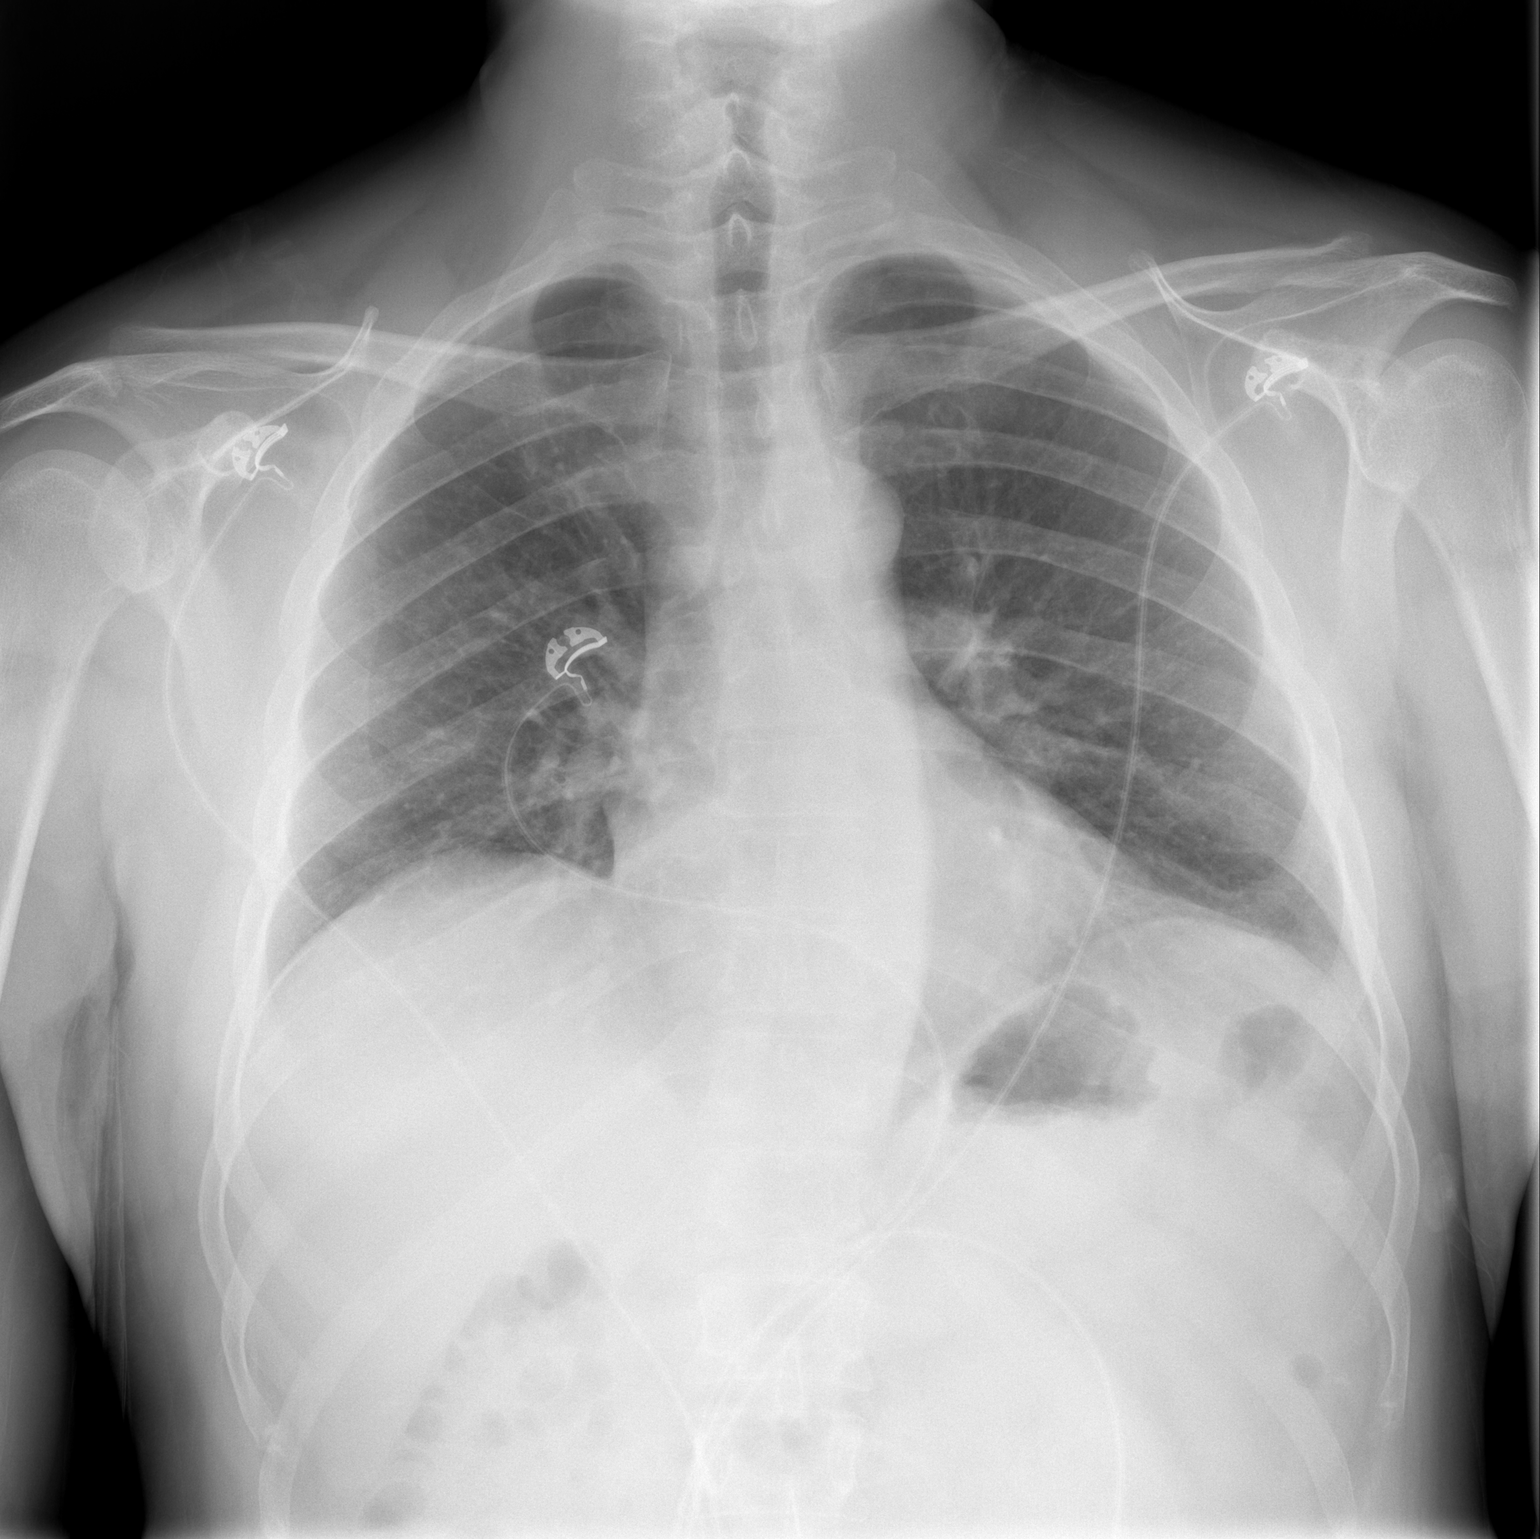

[w chest lat]
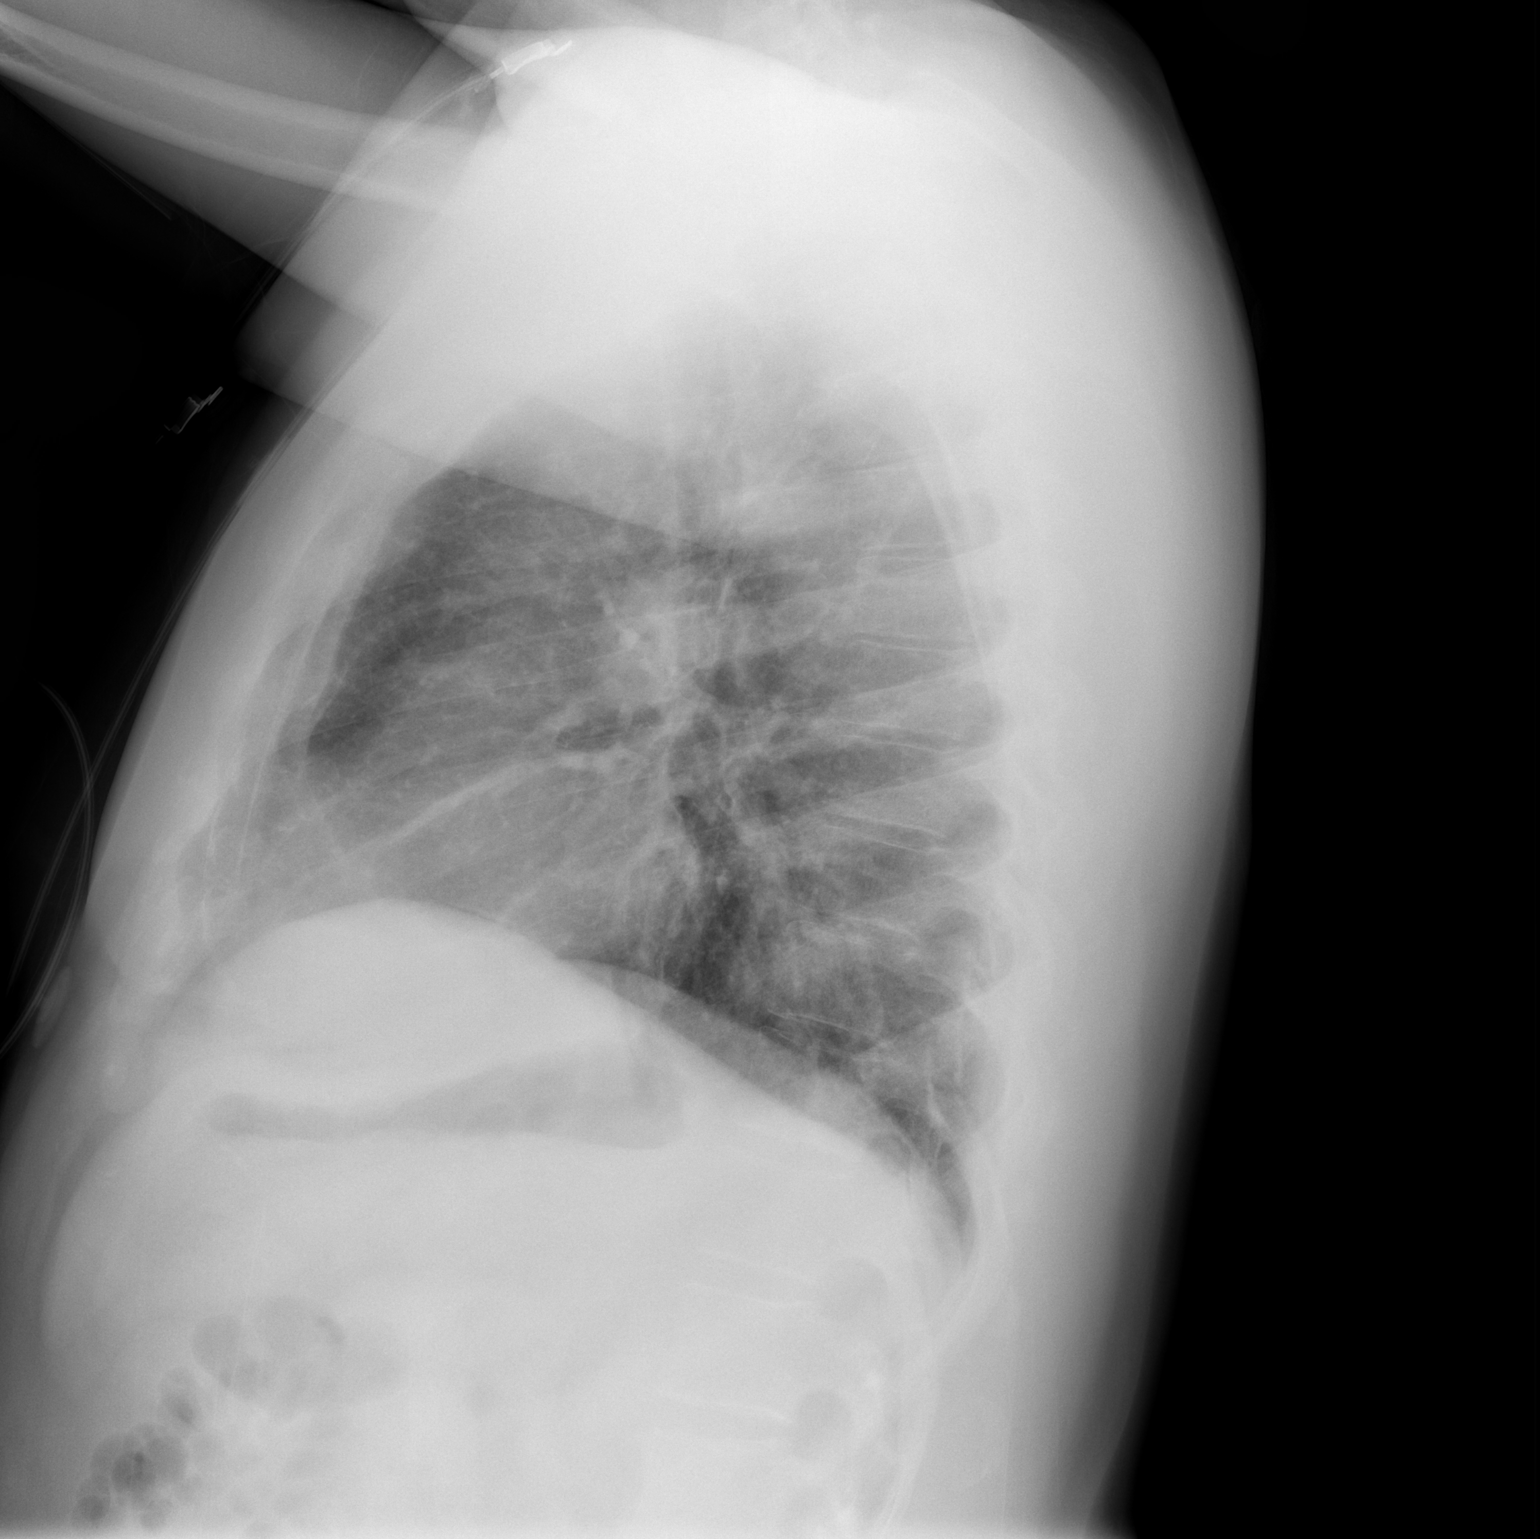

[2 of 2 positions shown; findings below may reference images not displayed]

FINDINGS: This is a mildly low volume film.

The cardiomediastinal silhouette is unremarkable.

There is no evidence of focal airspace disease, pulmonary edema,
suspicious pulmonary nodule/mass, pleural effusion, or pneumothorax.
No acute bony abnormalities are identified.
IMPRESSION: No active cardiopulmonary disease.

## 2018-03-24 DIAGNOSIS — D2272 Melanocytic nevi of left lower limb, including hip: Secondary | ICD-10-CM | POA: Diagnosis not present

## 2018-03-24 DIAGNOSIS — D2262 Melanocytic nevi of left upper limb, including shoulder: Secondary | ICD-10-CM | POA: Diagnosis not present

## 2018-03-24 DIAGNOSIS — D2261 Melanocytic nevi of right upper limb, including shoulder: Secondary | ICD-10-CM | POA: Diagnosis not present

## 2018-03-24 DIAGNOSIS — D225 Melanocytic nevi of trunk: Secondary | ICD-10-CM | POA: Diagnosis not present

## 2018-04-14 ENCOUNTER — Other Ambulatory Visit: Payer: Self-pay

## 2018-04-14 ENCOUNTER — Ambulatory Visit (INDEPENDENT_AMBULATORY_CARE_PROVIDER_SITE_OTHER): Payer: BLUE CROSS/BLUE SHIELD | Admitting: Family Medicine

## 2018-04-14 ENCOUNTER — Encounter: Payer: Self-pay | Admitting: Family Medicine

## 2018-04-14 VITALS — BP 124/90 | HR 82 | Resp 16 | Ht 65.5 in | Wt 192.6 lb

## 2018-04-14 DIAGNOSIS — E785 Hyperlipidemia, unspecified: Secondary | ICD-10-CM | POA: Diagnosis not present

## 2018-04-14 DIAGNOSIS — K219 Gastro-esophageal reflux disease without esophagitis: Secondary | ICD-10-CM

## 2018-04-14 DIAGNOSIS — I10 Essential (primary) hypertension: Secondary | ICD-10-CM

## 2018-04-14 DIAGNOSIS — F411 Generalized anxiety disorder: Secondary | ICD-10-CM

## 2018-04-14 DIAGNOSIS — F410 Panic disorder [episodic paroxysmal anxiety] without agoraphobia: Secondary | ICD-10-CM

## 2018-04-14 DIAGNOSIS — Z1159 Encounter for screening for other viral diseases: Secondary | ICD-10-CM

## 2018-04-14 DIAGNOSIS — Z131 Encounter for screening for diabetes mellitus: Secondary | ICD-10-CM

## 2018-04-14 DIAGNOSIS — Z125 Encounter for screening for malignant neoplasm of prostate: Secondary | ICD-10-CM

## 2018-04-14 MED ORDER — LOSARTAN POTASSIUM 25 MG PO TABS: 25.0000 mg | ORAL_TABLET | Freq: Every day | ORAL | 1 refills | Status: DC

## 2018-04-14 MED ORDER — CITALOPRAM HYDROBROMIDE 20 MG PO TABS: 20.0000 mg | ORAL_TABLET | Freq: Every day | ORAL | 1 refills | Status: DC

## 2018-04-14 NOTE — Patient Instructions (Signed)
Preventive Care 40-64 Years, Male Preventive care refers to lifestyle choices and visits with your health care provider that can promote health and wellness. What does preventive care include?   A yearly physical exam. This is also called an annual well check.  Dental exams once or twice a year.  Routine eye exams. Ask your health care provider how often you should have your eyes checked.  Personal lifestyle choices, including: ? Daily care of your teeth and gums. ? Regular physical activity. ? Eating a healthy diet. ? Avoiding tobacco and drug use. ? Limiting alcohol use. ? Practicing safe sex. ? Taking low-dose aspirin every day starting at age 50. What happens during an annual well check? The services and screenings done by your health care provider during your annual well check will depend on your age, overall health, lifestyle risk factors, and family history of disease. Counseling Your health care provider may ask you questions about your:  Alcohol use.  Tobacco use.  Drug use.  Emotional well-being.  Home and relationship well-being.  Sexual activity.  Eating habits.  Work and work environment. Screening You may have the following tests or measurements:  Height, weight, and BMI.  Blood pressure.  Lipid and cholesterol levels. These may be checked every 5 years, or more frequently if you are over 50 years old.  Skin check.  Lung cancer screening. You may have this screening every year starting at age 55 if you have a 30-pack-year history of smoking and currently smoke or have quit within the past 15 years.  Colorectal cancer screening. All adults should have this screening starting at age 50 and continuing until age 75. Your health care provider may recommend screening at age 45. You will have tests every 1-10 years, depending on your results and the type of screening test. People at increased risk should start screening at an earlier age. Screening tests may  include: ? Guaiac-based fecal occult blood testing. ? Fecal immunochemical test (FIT). ? Stool DNA test. ? Virtual colonoscopy. ? Sigmoidoscopy. During this test, a flexible tube with a tiny camera (sigmoidoscope) is used to examine your rectum and lower colon. The sigmoidoscope is inserted through your anus into your rectum and lower colon. ? Colonoscopy. During this test, a long, thin, flexible tube with a tiny camera (colonoscope) is used to examine your entire colon and rectum.  Prostate cancer screening. Recommendations will vary depending on your family history and other risks.  Hepatitis C blood test.  Hepatitis B blood test.  Sexually transmitted disease (STD) testing.  Diabetes screening. This is done by checking your blood sugar (glucose) after you have not eaten for a while (fasting). You may have this done every 1-3 years. Discuss your test results, treatment options, and if necessary, the need for more tests with your health care provider. Vaccines Your health care provider may recommend certain vaccines, such as:  Influenza vaccine. This is recommended every year.  Tetanus, diphtheria, and acellular pertussis (Tdap, Td) vaccine. You may need a Td booster every 10 years.  Varicella vaccine. You may need this if you have not been vaccinated.  Zoster vaccine. You may need this after age 60.  Measles, mumps, and rubella (MMR) vaccine. You may need at least one dose of MMR if you were born in 1957 or later. You may also need a second dose.  Pneumococcal 13-valent conjugate (PCV13) vaccine. You may need this if you have certain conditions and have not been vaccinated.  Pneumococcal polysaccharide (PPSV23) vaccine.   You may need one or two doses if you smoke cigarettes or if you have certain conditions.  Meningococcal vaccine. You may need this if you have certain conditions.  Hepatitis A vaccine. You may need this if you have certain conditions or if you travel or work in  places where you may be exposed to hepatitis A.  Hepatitis B vaccine. You may need this if you have certain conditions or if you travel or work in places where you may be exposed to hepatitis B.  Haemophilus influenzae type b (Hib) vaccine. You may need this if you have certain risk factors. Talk to your health care provider about which screenings and vaccines you need and how often you need them. This information is not intended to replace advice given to you by your health care provider. Make sure you discuss any questions you have with your health care provider. Document Released: 02/11/2015 Document Revised: 03/07/2017 Document Reviewed: 11/16/2014 Elsevier Interactive Patient Education  2019 Elsevier Inc.  

## 2018-04-14 NOTE — Progress Notes (Signed)
Name: Kevin Bernard   MRN: [id number]    DOB: 1965-07-02   Date:04/14/2018       Progress Note  Subjective  Chief Complaint  Chief Complaint  Patient presents with  . Annual Exam  . Medication Refill  . Hypertension  . GAD  . Gastroesophageal Reflux  . Dyslipidemia    HPI  Patient presents for annual CPE .  HTN: bp at home has been 120/80's on lisinopril 10 mg daily, but DBP elevated today , he  denies chest pain or palpitation. Taking lisinopril and has noticed a dry cough we will change to losartan, he was advised to return in one week for bp check only with CMA   Dyslipidemia: HDL dropped to 30 at work, discussed life style modification, he has been eating almonds daily, trying to increase fish in diet and he has been active on weekends [place] The 10-year ASCVD risk score Mikey Bussing DC Brooke Bonito., et al., 2013) is: 7.7%*   Values used to calculate the score:     Age: 53 years     Sex: Male     Is Non-Hispanic African American: No     Diabetic: No     Tobacco smoker: No     Systolic Blood Pressure: 845 mmHg     Is BP treated: Yes     HDL Cholesterol: 30 mg/dL*     Total Cholesterol: 201 mg/dL*     * - Cholesterol units were assumed for this score calculation\   GAD: he tried Zoloft but did not control symptoms, responded to Prozac but it caused tremors, since 07/2016 he has been on Citalopram and is doing well. No side effects, helping with anxiety, score is 1 today, he will continue taking medication No longer losing his temper and tolerating stress much better  GERD: he is taking Ranitidine daily, and symptoms under control.  He likes to eat spicy food and has flares intermittently but not very often with medication Unchanged    USPSTF grade A and B recommendations:  Diet: eating more at home  Exercise: more active  Depression:  Depression screen [facility] 2/9 04/14/2018 10/11/2017 04/09/2017 04/09/2017 08/15/2016  Decreased Interest 0 0 0 0 0   Down, Depressed, Hopeless 0 0 0 0 0  PHQ - 2 Score 0 0 0 0 0  Altered sleeping 0 2 0 - -  Tired, decreased energy 0 2 0 - -  Change in appetite 0 0 0 - -  Feeling bad or failure about yourself  0 0 0 - -  Trouble concentrating 0 0 0 - -  Moving slowly or fidgety/restless 0 0 0 - -  Suicidal thoughts 0 0 0 - -  PHQ-9 Score 0 4 0 - -  Difficult doing work/chores Not difficult at all Not difficult at all Not difficult at all - -    Hypertension:  BP Readings from Last 3 Encounters:  04/14/18 124/90  11/05/17 136/84  10/11/17 (!) 144/96    Obesity: Wt Readings from Last 3 Encounters:  04/14/18 192 lb 9.6 oz (87.4 kg)  10/11/17 193 lb 9.6 oz (87.8 kg)  04/09/17 193 lb 12.8 oz (87.9 kg)   BMI Readings from Last 3 Encounters:  04/14/18 31.56 kg/m  10/11/17 31.25 kg/m  04/09/17 31.28 kg/m     Lipids:  Lab Results  Component Value Date   CHOL 201 (A) 09/11/2017   CHOL 191 04/09/2017   CHOL 200 (H) 12/29/2015  Lab Results  Component Value Date   HDL 30 (A) 09/11/2017   HDL 40 (L) 04/09/2017   HDL 37 (L) 12/29/2015   Lab Results  Component Value Date   LDLCALC 148 09/11/2017   LDLCALC 121 (H) 04/09/2017   LDLCALC 145 (H) 12/29/2015   Lab Results  Component Value Date   TRIG 114 09/11/2017   TRIG 183 (H) 04/09/2017   TRIG 92 12/29/2015   Lab Results  Component Value Date   CHOLHDL 4.8 04/09/2017   CHOLHDL 5.4 (H) 12/29/2015   No results found for: LDLDIRECT Glucose:  Glucose  Date Value Ref Range Status  11/07/2017 84 65 - 99 mg/dL Final   Glucose, Bld  Date Value Ref Range Status  04/09/2017 96 65 - 139 mg/dL Final    Comment:    .        Non-fasting reference interval .   05/12/2016 120 (H) 65 - 99 mg/dL Final  12/29/2015 100 (H) 65 - 99 mg/dL Final      [facility] Visit from 04/14/2018 in [facility]  AUDIT-C Score  3      Married STD testing and prevention (HIV/chl/gon/syphilis): N/A Hep C: we will check today    Skin cancer: discussed atypical lesions  Colorectal cancer: repeat in 2021  Prostate cancer: recheck today  Lab Results  Component Value Date   PSA 1.3 04/09/2017   PSA 1.7 12/29/2015   PSA 1.7 06/10/2014    IPSS Questionnaire (AUA-7): Over the past month.   1)  How often have you had a sensation of not emptying your bladder completely after you finish urinating?  1 - Less than one in 5   2)  How often have you had to urinate again less than two hours after you finished urinating? 1 - Less than 1 time in 5  3)  How often have you found you stopped and started again several times when you urinated?  0 - Not at all  4) How difficult have you found it to postpone urination?  0 - Not at all  5) How often have you had a weak urinary stream?  0 - Not at all  6) How often have you had to push or strain to begin urination?  0 - Not at all  7) How many times did you most typically get up to urinate from the time you went to bed until the time you got up in the morning?  1 - 1 time  Total score:  0-7 mildly symptomatic   8-19 moderately symptomatic   20-35 severely symptomatic    Lung cancer:   Low Dose CT Chest recommended if Age 20-80 years, 30 pack-year currently smoking OR have quit w/in 15years. Patient does not qualify.   ECG:  2018  Advanced Care Planning: A voluntary discussion about advance care planning including the explanation and discussion of advance directives.  Discussed health care proxy and Living will, and the patient was able to identify a health care proxy as wife.   Patient does not have a living will at present time. If patient does have living will, I have requested they bring this to the clinic to be scanned in to their chart.  Patient Active Problem List   Diagnosis Date Noted  . Panic attack 05/20/2016  . Abnormal EKG 05/18/2016  . Tachycardia 05/18/2016  . Anxiety about health 05/08/2016  . Internal hemorrhoids 12/29/2015  . Back pain, chronic 01/18/2015  .  Gastro-esophageal reflux  disease without esophagitis 01/18/2015  . Numerous moles 01/18/2015  . Hyperglycemia 01/18/2015  . History of fracture of femur 01/18/2015    Past Surgical History:  Procedure Laterality Date  . APPENDECTOMY    . right femur internal fixiation    . VASECTOMY      Family History  Problem Relation Age of Onset  . CVA Mother   . Cancer Father 75       Pancreatic  . Diabetes Father   . Depression Brother   . Depression Daughter   . Epilepsy Brother     Social History   Socioeconomic History  . Marital status: Married    Spouse name: Abigail Butts  . Number of children: 4  . Years of education: Not on file  . Highest education level: Some college, no degree  Occupational History  . Occupation: techinal specialist     Employer: [employer]  . Financial resource strain: Not hard at all  . Food insecurity:    Worry: Never true    Inability: Never true  . Transportation needs:    Medical: No    Non-medical: No  Tobacco Use  . Smoking status: Former Smoker    Packs/day: 1.00    Years: 2.00    Pack years: 2.00    Last attempt to quit: 01/30/1988    Years since quitting: 30.2  . Smokeless tobacco: Never Used  Substance and Sexual Activity  . Alcohol use: Yes    Alcohol/week: 5.0 standard drinks    Types: 5 Cans of beer per week  . Drug use: No  . Sexual activity: Yes    Partners: Female    Birth control/protection: None  Lifestyle  . Physical activity:    Days per week: 2 days    Minutes per session: Not on file  . Stress: Only a little  Relationships  . Social connections:    Talks on phone: More than three times a week    Gets together: More than three times a week    Attends religious service: More than 4 times per year    Active member of club or organization: No    Attends meetings of clubs or organizations: Never    Relationship status: Married  . Intimate partner violence:    Fear of current or ex partner: No     Emotionally abused: No    Physically abused: No    Forced sexual activity: No  Other Topics Concern  . Not on file  Social History Narrative   Married, they have 4 children.      Current Outpatient Medications:  .  aspirin EC 81 MG tablet, Take 1 tablet (81 mg total) by mouth daily., Disp: 30 tablet, Rfl: 0 .  citalopram (CELEXA) 20 MG tablet, Take 1 tablet (20 mg total) by mouth daily., Disp: 90 tablet, Rfl: 1 .  Multiple Vitamin (MULTIVITAMINS PO), Take 1 tablet by mouth daily., Disp: , Rfl:  .  ranitidine (ZANTAC) 300 MG tablet, Take 1 tablet (300 mg total) by mouth at bedtime., Disp: 90 tablet, Rfl: 1 .  losartan (COZAAR) 25 MG tablet, Take 1 tablet (25 mg total) by mouth daily., Disp: 90 tablet, Rfl: 1  Allergies  Allergen Reactions  . Ace Inhibitors Cough     ROS  Constitutional: Negative for fever or weight change.  Respiratory: Negative for cough and shortness of breath.   Cardiovascular: Negative for chest pain or palpitations.  Gastrointestinal: Negative for abdominal pain, no  bowel changes.  Musculoskeletal: Negative for gait problem or joint swelling.  Skin: Negative for rash.  Neurological: Negative for dizziness or headache.  No other specific complaints in a complete review of systems (except as listed in HPI above).  Objective  Vitals:   04/14/18 1507  BP: 124/90  Pulse: 82  Resp: 16  SpO2: 98%  Weight: 192 lb 9.6 oz (87.4 kg)  Height: 5' 5.5" (1.664 m)    Body mass index is 31.56 kg/m.  Physical Exam  Constitutional: Patient appears well-developed and well-nourished. No distress.  HENT: Head: Normocephalic and atraumatic. Ears: B TMs ok, no erythema or effusion; Nose: Nose normal. Mouth/Throat: Oropharynx is clear and moist. No oropharyngeal exudate.  Eyes: Conjunctivae and EOM are normal. Pupils are equal, round, and reactive to light. No scleral icterus.  Neck: Normal range of motion. Neck supple. No JVD present. No thyromegaly present.   Cardiovascular: Normal rate, regular rhythm and normal heart sounds.  No murmur heard. No BLE edema. Pulmonary/Chest: Effort normal and breath sounds normal. No respiratory distress. Abdominal: Soft. Bowel sounds are normal, no distension. There is no tenderness. no masses MALE GENITALIA: Normal descended testes bilaterally, no masses palpated, no hernias, no lesions, no discharge RECTAL: Prostate normal size and consistency, no rectal masses or hemorrhoids Musculoskeletal: Normal range of motion, no joint effusions. No gross deformities Neurological: he is alert and oriented to person, place, and time. No cranial nerve deficit. Coordination, balance, strength, speech and gait are normal.  Skin: Skin is warm and dry. No rash noted. No erythema.  Psychiatric: Patient has a normal mood and affect. behavior is normal. Judgment and thought content normal.   PHQ2/9: Depression screen [facility] 2/9 04/14/2018 10/11/2017 04/09/2017 04/09/2017 08/15/2016  Decreased Interest 0 0 0 0 0  Down, Depressed, Hopeless 0 0 0 0 0  PHQ - 2 Score 0 0 0 0 0  Altered sleeping 0 2 0 - -  Tired, decreased energy 0 2 0 - -  Change in appetite 0 0 0 - -  Feeling bad or failure about yourself  0 0 0 - -  Trouble concentrating 0 0 0 - -  Moving slowly or fidgety/restless 0 0 0 - -  Suicidal thoughts 0 0 0 - -  PHQ-9 Score 0 4 0 - -  Difficult doing work/chores Not difficult at all Not difficult at all Not difficult at all - -     GAD 7 : Generalized Anxiety Score 04/14/2018 10/11/2017 04/09/2017 08/15/2016  Nervous, Anxious, on Edge 1 3 1 2   Control/stop worrying 0 3 0 3  Worry too much - different things 0 3 1 0  Trouble relaxing 0 1 1 1   Restless 0 0 3 2  Easily annoyed or irritable 0 0 0 0  Afraid - awful might happen 0 0 0 0  Total GAD 7 Score 1 10 6 8   Anxiety Difficulty Not difficult at all - Somewhat difficult Not difficult at all     Fall Risk: Fall Risk  04/14/2018 10/11/2017 04/09/2017 08/15/2016 05/18/2016   Falls in the past year? 0 No No No No  Number falls in past yr: 0 - - - -  Injury with Fall? 0 - - - -     Functional Status Survey: Is the patient deaf or have difficulty hearing?: No Does the patient have difficulty seeing, even when wearing glasses/contacts?: No Does the patient have difficulty concentrating, remembering, or making decisions?: No Does the patient have difficulty walking  or climbing stairs?: No Does the patient have difficulty dressing or bathing?: No Does the patient have difficulty doing errands alone such as visiting a doctor's office or shopping?: No   Assessment & Plan  1. Hypertension, benign  - COMPLETE METABOLIC PANEL WITH GFR - losartan (COZAAR) 25 MG tablet; Take 1 tablet (25 mg total) by mouth daily.  Dispense: 90 tablet; Refill: 1  2. Dyslipidemia  - Lipid panel  3. GAD (generalized anxiety disorder)   4. Prostate cancer screening  - PSA  5. Gastro-esophageal reflux disease without esophagitis   6. Diabetes mellitus screening  - Hemoglobin A1c    -Prostate cancer screening and PSA options (with potential risks and benefits of testing vs not testing) were discussed along with recent recs/guidelines. -USPSTF grade A and B recommendations reviewed with patient; age-appropriate recommendations, preventive care, screening tests, etc discussed and encouraged; healthy living encouraged; see AVS for patient education given to patient -Discussed importance of 150 minutes of physical activity weekly, eat two servings of fish weekly, eat one serving of tree nuts ( cashews, pistachios, pecans, almonds.Marland Kitchen) every other day, eat 6 servings of fruit/vegetables daily and drink plenty of water and avoid sweet beverages.

## 2018-04-15 LAB — COMPLETE METABOLIC PANEL WITH GFR
AG Ratio: 2.2 (calc) (ref 1.0–2.5)
ALT: 27 U/L (ref 9–46)
AST: 19 U/L (ref 10–35)
Albumin: 4.6 g/dL (ref 3.6–5.1)
Alkaline phosphatase (APISO): 76 U/L (ref 35–144)
BUN: 16 mg/dL (ref 7–25)
CO2: 27 mmol/L (ref 20–32)
Calcium: 9.4 mg/dL (ref 8.6–10.3)
Chloride: 102 mmol/L (ref 98–110)
Creat: 1.1 mg/dL (ref 0.70–1.33)
GFR, Est African American: 89 mL/min/{1.73_m2} (ref >=60)
GFR, Est Non African American: 77 mL/min/{1.73_m2} (ref >=60)
GLOBULIN: 2.1 g/dL (ref 1.9–3.7)
Glucose, Bld: 85 mg/dL (ref 65–99)
Potassium: 3.9 mmol/L (ref 3.5–5.3)
SODIUM: 139 mmol/L (ref 135–146)
Total Bilirubin: 0.5 mg/dL (ref 0.2–1.2)
Total Protein: 6.7 g/dL (ref 6.1–8.1)

## 2018-04-15 LAB — LIPID PANEL
Cholesterol: 210 mg/dL — ABNORMAL HIGH (ref <200)
HDL: 39 mg/dL — ABNORMAL LOW (ref >=40)
LDL Cholesterol (Calc): 142 mg/dL (calc) — ABNORMAL HIGH
NON-HDL CHOLESTEROL (CALC): 171 mg/dL — AB (ref <130)
Total CHOL/HDL Ratio: 5.4 (calc) — ABNORMAL HIGH (ref <5.0)
Triglycerides: 155 mg/dL — ABNORMAL HIGH (ref <150)

## 2018-04-15 LAB — HEMOGLOBIN A1C
EAG (MMOL/L): 6.6 (calc)
Hgb A1c MFr Bld: 5.8 % of total Hgb — ABNORMAL HIGH (ref <5.7)
Mean Plasma Glucose: 120 (calc)

## 2018-04-15 LAB — HEPATITIS C ANTIBODY
Hepatitis C Ab: NONREACTIVE
SIGNAL TO CUT-OFF: 0.01 (ref <1.00)

## 2018-04-15 LAB — PSA: PSA: 3.1 ng/mL (ref <=4.0)

## 2018-04-16 ENCOUNTER — Encounter: Payer: Self-pay | Admitting: Family Medicine

## 2018-04-16 DIAGNOSIS — R7303 Prediabetes: Secondary | ICD-10-CM | POA: Insufficient documentation

## 2018-04-29 ENCOUNTER — Other Ambulatory Visit: Payer: Self-pay | Admitting: Family Medicine

## 2018-04-29 DIAGNOSIS — K219 Gastro-esophageal reflux disease without esophagitis: Secondary | ICD-10-CM

## 2018-05-14 ENCOUNTER — Other Ambulatory Visit: Payer: Self-pay | Admitting: Family Medicine

## 2018-05-14 ENCOUNTER — Telehealth: Payer: Self-pay

## 2018-05-14 MED ORDER — FAMOTIDINE 40 MG PO TABS: 40.0000 mg | ORAL_TABLET | Freq: Every day | ORAL | 1 refills | Status: DC

## 2018-05-14 NOTE — Telephone Encounter (Signed)
Patient is request alternative for Ranitidine since it has been recalled. Please advise. Please send to [facility] on [address]

## 2018-07-24 ENCOUNTER — Other Ambulatory Visit: Payer: Self-pay | Admitting: Family Medicine

## 2018-07-24 DIAGNOSIS — I10 Essential (primary) hypertension: Secondary | ICD-10-CM

## 2018-10-15 ENCOUNTER — Ambulatory Visit (INDEPENDENT_AMBULATORY_CARE_PROVIDER_SITE_OTHER): Payer: BLUE CROSS/BLUE SHIELD | Admitting: Family Medicine

## 2018-10-15 ENCOUNTER — Encounter: Payer: Self-pay | Admitting: Family Medicine

## 2018-10-15 ENCOUNTER — Other Ambulatory Visit: Payer: Self-pay

## 2018-10-15 VITALS — Wt 190.0 lb

## 2018-10-15 DIAGNOSIS — F410 Panic disorder [episodic paroxysmal anxiety] without agoraphobia: Secondary | ICD-10-CM

## 2018-10-15 DIAGNOSIS — F411 Generalized anxiety disorder: Secondary | ICD-10-CM

## 2018-10-15 DIAGNOSIS — I10 Essential (primary) hypertension: Secondary | ICD-10-CM | POA: Diagnosis not present

## 2018-10-15 DIAGNOSIS — E785 Hyperlipidemia, unspecified: Secondary | ICD-10-CM

## 2018-10-15 DIAGNOSIS — K219 Gastro-esophageal reflux disease without esophagitis: Secondary | ICD-10-CM | POA: Diagnosis not present

## 2018-10-15 MED ORDER — FAMOTIDINE 40 MG PO TABS: 40.0000 mg | ORAL_TABLET | Freq: Every day | ORAL | 1 refills | Status: DC

## 2018-10-15 MED ORDER — CITALOPRAM HYDROBROMIDE 20 MG PO TABS: 20.0000 mg | ORAL_TABLET | Freq: Every day | ORAL | 1 refills | Status: DC

## 2018-10-15 MED ORDER — LOSARTAN POTASSIUM 25 MG PO TABS: 25.0000 mg | ORAL_TABLET | Freq: Every day | ORAL | 1 refills | Status: DC

## 2018-10-15 NOTE — Progress Notes (Signed)
Name: Kevin Bernard   MRN: [id number]    DOB: 02-26-1965   Date:10/15/2018       Progress Note  Subjective  Chief Complaint  Chief Complaint  Patient presents with  . Follow-up    6 month follow up    I connected with  Glade Lloyd  on 10/15/18 at  3:40 PM EDT by a video enabled telemedicine application and verified that I am speaking with the correct person using two identifiers.  I discussed the limitations of evaluation and management by telemedicine and the availability of in person appointments. The patient expressed understanding and agreed to proceed. Staff also discussed with the patient that there may be a patient responsible charge related to this service. Patient Location: at [facility] parking lot Provider Location: [facility]   HPI  HTN: bp at home has been 120/80's we switched him from Lisinopril 10 mg to Losartan 50 mg back in March 2020 because lisinopril was causing a dry cough. He states he has been doing well, no chest pain he has intermittent heart flutter when he is anxious or nervous    Dyslipidemia: HDL dropped to 30 at work, discussed life style modification, he has been eating almonds daily, trying to increase fish in diet and he has been active on weekends [place]  GAD: he tried Zoloft but did not control symptoms, responded to Prozac but it caused tremors, since 07/2016 he has been on Citalopram and is doing well. No side effects, helping with anxiety, score is 1 today, he will continue taking medication . He denies any panic attacks  GERD: he is now on Famotidine  daily,and symptoms under control.He likes to eat spicy food and has flares intermittently but not very often with medication. He is happy with medication    Patient Active Problem List   Diagnosis Date Noted  . Pre-diabetes 04/16/2018  . Panic attack 05/20/2016  . Abnormal EKG 05/18/2016  . Anxiety about health 05/08/2016  . Internal  hemorrhoids 12/29/2015  . Back pain, chronic 01/18/2015  . Gastro-esophageal reflux disease without esophagitis 01/18/2015  . Numerous moles 01/18/2015  . Hyperglycemia 01/18/2015  . History of fracture of femur 01/18/2015    Past Surgical History:  Procedure Laterality Date  . APPENDECTOMY    . right femur internal fixiation    . VASECTOMY      Family History  Problem Relation Age of Onset  . CVA Mother   . Cancer Father 71       Pancreatic  . Diabetes Father   . Depression Brother   . Depression Daughter   . Epilepsy Brother     Social History   Socioeconomic History  . Marital status: Married    Spouse name: Abigail Butts  . Number of children: 4  . Years of education: Not on file  . Highest education level: Some college, no degree  Occupational History  . Occupation: techinal specialist     Employer: [employer]  . Financial resource strain: Not hard at all  . Food insecurity    Worry: Never true    Inability: Never true  . Transportation needs    Medical: No    Non-medical: No  Tobacco Use  . Smoking status: Former Smoker    Packs/day: 1.00    Years: 2.00    Pack years: 2.00    Quit date: 01/30/1988    Years since quitting: 30.7  . Smokeless tobacco:  Never Used  Substance and Sexual Activity  . Alcohol use: Yes    Alcohol/week: 5.0 standard drinks    Types: 5 Cans of beer per week  . Drug use: No  . Sexual activity: Yes    Partners: Female    Birth control/protection: None  Lifestyle  . Physical activity    Days per week: 2 days    Minutes per session: Not on file  . Stress: Only a little  Relationships  . Social connections    Talks on phone: More than three times a week    Gets together: More than three times a week    Attends religious service: More than 4 times per year    Active member of club or organization: No    Attends meetings of clubs or organizations: Never    Relationship status: Married  . Intimate partner violence     Fear of current or ex partner: No    Emotionally abused: No    Physically abused: No    Forced sexual activity: No  Other Topics Concern  . Not on file  Social History Narrative   Married, they have 4 children.      Current Outpatient Medications:  .  citalopram (CELEXA) 20 MG tablet, Take 1 tablet (20 mg total) by mouth daily., Disp: 90 tablet, Rfl: 1 .  famotidine (PEPCID) 40 MG tablet, Take 1 tablet (40 mg total) by mouth daily., Disp: 180 tablet, Rfl: 1 .  losartan (COZAAR) 25 MG tablet, TAKE 1 TABLET BY MOUTH EVERY DAY, Disp: 90 tablet, Rfl: 0 .  Multiple Vitamin (MULTIVITAMINS PO), Take 1 tablet by mouth daily., Disp: , Rfl:  .  aspirin EC 81 MG tablet, Take 1 tablet (81 mg total) by mouth daily. (Patient not taking: Reported on 10/15/2018), Disp: 30 tablet, Rfl: 0  Allergies  Allergen Reactions  . Ace Inhibitors Cough    I personally reviewed active problem list, medication list, allergies, family history, social history, health maintenance with the patient/caregiver today.   ROS  Ten systems reviewed and is negative except as mentioned in HPI   Objective  Virtual encounter, vitals not obtained.  There is no height or weight on file to calculate BMI.  Physical Exam  Awake, alert and oriented   PHQ2/9: Depression screen [facility] 2/9 10/15/2018 04/14/2018 10/11/2017 04/09/2017 04/09/2017  Decreased Interest 0 0 0 0 0  Down, Depressed, Hopeless 0 0 0 0 0  PHQ - 2 Score 0 0 0 0 0  Altered sleeping 1 0 2 0 -  Tired, decreased energy 0 0 2 0 -  Change in appetite 0 0 0 0 -  Feeling bad or failure about yourself  0 0 0 0 -  Trouble concentrating 0 0 0 0 -  Moving slowly or fidgety/restless 0 0 0 0 -  Suicidal thoughts 0 0 0 0 -  PHQ-9 Score 1 0 4 0 -  Difficult doing work/chores Not difficult at all Not difficult at all Not difficult at all Not difficult at all -   PHQ-2/9 Result is negative.    Fall Risk: Fall Risk  10/15/2018 04/14/2018 10/11/2017 04/09/2017 08/15/2016   Falls in the past year? 0 0 No No No  Number falls in past yr: 0 0 - - -  Injury with Fall? 0 0 - - -     Assessment & Plan  1. Hypertension, benign  - losartan (COZAAR) 25 MG tablet; Take 1 tablet (25 mg total) by mouth  daily.  Dispense: 90 tablet; Refill: 1  2. Dyslipidemia   3. GAD (generalized anxiety disorder)  - citalopram (CELEXA) 20 MG tablet; Take 1 tablet (20 mg total) by mouth daily.  Dispense: 90 tablet; Refill: 1  4. Gastro-esophageal reflux disease without esophagitis  - famotidine (PEPCID) 40 MG tablet; Take 1 tablet (40 mg total) by mouth daily.  Dispense: 180 tablet; Refill: 1  5. Panic attack  - citalopram (CELEXA) 20 MG tablet; Take 1 tablet (20 mg total) by mouth daily.  Dispense: 90 tablet; Refill: 1 I discussed the assessment and treatment plan with the patient. The patient was provided an opportunity to ask questions and all were answered. The patient agreed with the plan and demonstrated an understanding of the instructions.  The patient was advised to call back or seek an in-person evaluation if the symptoms worsen or if the condition fails to improve as anticipated.  I provided 25 minutes of non-face-to-face time during this encounter.

## 2018-12-09 DIAGNOSIS — Z20828 Contact with and (suspected) exposure to other viral communicable diseases: Secondary | ICD-10-CM | POA: Diagnosis not present

## 2018-12-15 ENCOUNTER — Telehealth: Payer: Self-pay | Admitting: Family Medicine

## 2018-12-15 NOTE — Telephone Encounter (Signed)
Pt called and stated that he tested positive for covid 12/09/18 at [facility] and would like a call back because he will need a return to work note. Please advise

## 2018-12-16 ENCOUNTER — Other Ambulatory Visit: Payer: Self-pay

## 2018-12-16 ENCOUNTER — Ambulatory Visit (INDEPENDENT_AMBULATORY_CARE_PROVIDER_SITE_OTHER): Payer: BLUE CROSS/BLUE SHIELD | Admitting: Family Medicine

## 2018-12-16 ENCOUNTER — Encounter: Payer: Self-pay | Admitting: Family Medicine

## 2018-12-16 VITALS — HR 99 | Temp 97.2°F

## 2018-12-16 DIAGNOSIS — R0981 Nasal congestion: Secondary | ICD-10-CM

## 2018-12-16 DIAGNOSIS — U071 COVID-19: Secondary | ICD-10-CM | POA: Diagnosis not present

## 2018-12-16 NOTE — Progress Notes (Signed)
Name: Kevin Bernard   MRN: [id number]    DOB: 1965-09-12   Date:12/16/2018       Progress Note  Subjective  Chief Complaint  Chief Complaint  Patient presents with  . Positive for COVID-19    Had no symptoms-wife started having symptoms on 12/06/2018  . Nasal Congestion    I connected with  Glade Lloyd on 12/16/18 at 11:40 AM EST by telephone and verified that I am speaking with the correct person using two identifiers.  I discussed the limitations, risks, security and privacy concerns of performing an evaluation and management service by telephone and the availability of in person appointments. Staff also discussed with the patient that there may be a patient responsible charge related to this service. Patient Location: at home  Provider Location: [facility]   HPI  Positive COVID-19: he states wife developed symptoms on Saturday the 7 th of Nov, he was tested on 11/10 and was positive, he states he has nasal congestion but he thinks from allergies since symptoms present since Fall. He denies cough, wheezing, sob, or headaches. He has been afebrile His pulse ox is normal. He has been off work since tested positive, co-workers were tested after his results came back and everyone was negative. He denies fatigue or body aches. He called to find out if he can go back to work sooner. Explained that based on CDC guidelines he needs to wait for 10 days after tested positive if he remains asymptomatic. Return to work day is the 23rd. He state he does not need a note for work at this time  Patient Active Problem List   Diagnosis Date Noted  . Pre-diabetes 04/16/2018  . Panic attack 05/20/2016  . Abnormal EKG 05/18/2016  . Anxiety about health 05/08/2016  . Internal hemorrhoids 12/29/2015  . Back pain, chronic 01/18/2015  . Gastro-esophageal reflux disease without esophagitis 01/18/2015  . Numerous moles 01/18/2015  . Hyperglycemia 01/18/2015  . History of fracture of femur  01/18/2015    Past Surgical History:  Procedure Laterality Date  . APPENDECTOMY    . right femur internal fixiation    . VASECTOMY      Family History  Problem Relation Age of Onset  . CVA Mother   . Cancer Father 3       Pancreatic  . Diabetes Father   . Depression Brother   . Depression Daughter   . Epilepsy Brother     Social History   Socioeconomic History  . Marital status: Married    Spouse name: Abigail Butts  . Number of children: 4  . Years of education: Not on file  . Highest education level: Some college, no degree  Occupational History  . Occupation: techinal specialist     Employer: [employer]  . Financial resource strain: Not hard at all  . Food insecurity    Worry: Never true    Inability: Never true  . Transportation needs    Medical: No    Non-medical: No  Tobacco Use  . Smoking status: Former Smoker    Packs/day: 1.00    Years: 2.00    Pack years: 2.00    Quit date: 01/30/1988    Years since quitting: 30.8  . Smokeless tobacco: Never Used  Substance and Sexual Activity  . Alcohol use: Yes    Alcohol/week: 5.0 standard drinks    Types: 5 Cans of beer per week  . Drug use: No  . Sexual activity:  Yes    Partners: Female    Birth control/protection: None  Lifestyle  . Physical activity    Days per week: 2 days    Minutes per session: Not on file  . Stress: Only a little  Relationships  . Social connections    Talks on phone: More than three times a week    Gets together: More than three times a week    Attends religious service: More than 4 times per year    Active member of club or organization: No    Attends meetings of clubs or organizations: Never    Relationship status: Married  . Intimate partner violence    Fear of current or ex partner: No    Emotionally abused: No    Physically abused: No    Forced sexual activity: No  Other Topics Concern  . Not on file  Social History Narrative   Married, they have 4  children.      Current Outpatient Medications:  .  citalopram (CELEXA) 20 MG tablet, Take 1 tablet (20 mg total) by mouth daily., Disp: 90 tablet, Rfl: 1 .  famotidine (PEPCID) 40 MG tablet, Take 1 tablet (40 mg total) by mouth daily., Disp: 180 tablet, Rfl: 1 .  losartan (COZAAR) 25 MG tablet, Take 1 tablet (25 mg total) by mouth daily., Disp: 90 tablet, Rfl: 1 .  Multiple Vitamin (MULTIVITAMINS PO), Take 1 tablet by mouth daily., Disp: , Rfl:   Allergies  Allergen Reactions  . Ace Inhibitors Cough    I personally reviewed active problem list, medication list, allergies, family history, social history, health maintenance with the patient/caregiver today.   ROS  Ten systems reviewed and is negative except as mentioned in HPI   Objective  Virtual encounter, vitals obtained at home  Today's Vitals   12/16/18 0917  Pulse: 99  Temp: (!) 97.2 F (36.2 C)  TempSrc: Oral  SpO2: 99%   There is no height or weight on file to calculate BMI.  There is no height or weight on file to calculate BMI.  Physical Exam  Awake, alert and oriented   PHQ2/9: Depression screen [facility] 2/9 12/16/2018 10/15/2018 04/14/2018 10/11/2017 04/09/2017  Decreased Interest 0 0 0 0 0  Down, Depressed, Hopeless 0 0 0 0 0  PHQ - 2 Score 0 0 0 0 0  Altered sleeping 0 1 0 2 0  Tired, decreased energy 0 0 0 2 0  Change in appetite 0 0 0 0 0  Feeling bad or failure about yourself  0 0 0 0 0  Trouble concentrating 0 0 0 0 0  Moving slowly or fidgety/restless 0 0 0 0 0  Suicidal thoughts 0 0 0 0 0  PHQ-9 Score 0 1 0 4 0  Difficult doing work/chores Not difficult at all Not difficult at all Not difficult at all Not difficult at all Not difficult at all   PHQ-2/9 Result is negative.    Fall Risk: Fall Risk  12/16/2018 10/15/2018 04/14/2018 10/11/2017 04/09/2017  Falls in the past year? 0 0 0 No No  Number falls in past yr: 0 0 0 - -  Injury with Fall? 0 0 0 - -     Assessment & Plan  1. Lab test positive  for detection of COVID-19 virus  Spent time discussed CDC recommendations, reviewing symptoms of COVID and that even though doing well at this time, he can still develop symptoms between day 7-10 and if it happens he  needs to remain at home. Call us back if needed. Offered medication for symptoms but he states nasal congestion is from allergies and he does not like to take medications  2. Nasal congestion    I discussed the assessment and treatment plan with the patient. The patient was provided an opportunity to ask questions and all were answered. The patient agreed with the plan and demonstrated an understanding of the instructions.   The patient was advised to call back or seek an in-person evaluation if the symptoms worsen or if the condition fails to improve as anticipated.  I provided 10 minutes of non-face-to-face time during this encounter.  Loistine Chance, MD

## 2018-12-16 NOTE — Telephone Encounter (Signed)
Pt is scheduled for today  °

## 2018-12-19 ENCOUNTER — Telehealth: Payer: Self-pay

## 2018-12-19 MED ORDER — AZITHROMYCIN 250 MG PO TABS: ORAL_TABLET | ORAL | 0 refills | Status: DC

## 2018-12-19 NOTE — Telephone Encounter (Signed)
Called to inform patient per Dr. Ancil Boozer his head cold symptoms are covid related. She send in a Z-pak to his pharmacy but will not symptoms.

## 2018-12-19 NOTE — Telephone Encounter (Signed)
Copied from [place] [id number]. Topic: General - Other >> Dec 18, 2018  1:11 PM Rainey Pines A wrote: Patient was advised to call in today if still experiencing head cold  symptoms since appt 11/17 to get head cold medication called in. Please advise.  [facility] [place], [place] (Phone) [phone] (Fax)

## 2019-02-10 ENCOUNTER — Ambulatory Visit: Payer: BLUE CROSS/BLUE SHIELD | Admitting: Family Medicine

## 2019-02-23 ENCOUNTER — Telehealth: Payer: Self-pay | Admitting: Family Medicine

## 2019-02-23 NOTE — Telephone Encounter (Signed)
Called pt back and they understand that they need to schedule
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
## 2019-04-27 ENCOUNTER — Other Ambulatory Visit: Payer: Self-pay | Admitting: Family Medicine

## 2019-04-27 DIAGNOSIS — I10 Essential (primary) hypertension: Secondary | ICD-10-CM

## 2019-04-27 NOTE — Telephone Encounter (Signed)
Requested Prescriptions  Pending Prescriptions Disp Refills  . losartan (COZAAR) 25 MG tablet [Pharmacy Med Name: LOSARTAN POTASSIUM 25 MG TAB] 90 tablet 0    Sig: TAKE 1 TABLET BY MOUTH EVERY DAY     Cardiovascular:  Angiotensin Receptor Blockers Failed - 04/27/2019 11:23 AM      Failed - Cr in normal range and within 180 days    Creat  Date Value Ref Range Status  04/14/2018 1.10 0.70 - 1.33 mg/dL Final    Comment:    For patients >54 years of age, the reference limit for Creatinine is approximately 13% higher for people identified as African-American. .          Failed - K in normal range and within 180 days    Potassium  Date Value Ref Range Status  04/14/2018 3.9 3.5 - 5.3 mmol/L Final         Failed - Last BP in normal range    BP Readings from Last 1 Encounters:  04/14/18 124/90         Passed - Patient is not pregnant      Passed - Valid encounter within last 6 months    Recent Outpatient Visits          4 months ago Lab test positive for detection of COVID-19 virus   [facility] Steele Sizer, MD   6 months ago Hypertension, benign   [facility] Steele Sizer, MD   1 year ago Hypertension, benign   [facility] Steele Sizer, MD   1 year ago Hypertension, benign   [facility] Steele Sizer, MD   2 years ago Encounter for routine history and physical exam for male   [facility] Steele Sizer, MD

## 2019-05-03 ENCOUNTER — Other Ambulatory Visit: Payer: Self-pay | Admitting: Family Medicine

## 2019-05-03 DIAGNOSIS — F410 Panic disorder [episodic paroxysmal anxiety] without agoraphobia: Secondary | ICD-10-CM

## 2019-05-03 DIAGNOSIS — F411 Generalized anxiety disorder: Secondary | ICD-10-CM

## 2019-05-03 NOTE — Telephone Encounter (Signed)
Requested Prescriptions  Pending Prescriptions Disp Refills  . citalopram (CELEXA) 20 MG tablet [Pharmacy Med Name: CITALOPRAM HBR 20 MG TABLET] 90 tablet 0    Sig: TAKE 1 TABLET BY MOUTH EVERY DAY     Psychiatry:  Antidepressants - SSRI Passed - 05/03/2019 10:01 AM      Passed - Valid encounter within last 6 months    Recent Outpatient Visits          4 months ago Lab test positive for detection of COVID-19 virus   [facility] Steele Sizer, MD   6 months ago Hypertension, benign   [facility] Steele Sizer, MD   1 year ago Hypertension, benign   [facility] Steele Sizer, MD   1 year ago Hypertension, benign   [facility] Steele Sizer, MD   2 years ago Encounter for routine history and physical exam for male   [facility] Steele Sizer, MD

## 2019-06-23 DIAGNOSIS — D2261 Melanocytic nevi of right upper limb, including shoulder: Secondary | ICD-10-CM | POA: Diagnosis not present

## 2019-06-23 DIAGNOSIS — D2262 Melanocytic nevi of left upper limb, including shoulder: Secondary | ICD-10-CM | POA: Diagnosis not present

## 2019-06-23 DIAGNOSIS — D225 Melanocytic nevi of trunk: Secondary | ICD-10-CM | POA: Diagnosis not present

## 2019-06-23 DIAGNOSIS — D2272 Melanocytic nevi of left lower limb, including hip: Secondary | ICD-10-CM | POA: Diagnosis not present

## 2019-08-01 ENCOUNTER — Other Ambulatory Visit: Payer: Self-pay | Admitting: Family Medicine

## 2019-08-01 DIAGNOSIS — F410 Panic disorder [episodic paroxysmal anxiety] without agoraphobia: Secondary | ICD-10-CM

## 2019-08-01 DIAGNOSIS — F411 Generalized anxiety disorder: Secondary | ICD-10-CM

## 2019-08-01 NOTE — Telephone Encounter (Signed)
30 day courtesy RF Requested Prescriptions  Pending Prescriptions Disp Refills  . citalopram (CELEXA) 20 MG tablet [Pharmacy Med Name: CITALOPRAM HBR 20 MG TABLET] 30 tablet 0    Sig: TAKE 1 TABLET BY MOUTH EVERY DAY     Psychiatry:  Antidepressants - SSRI Failed - 08/01/2019  9:27 AM      Failed - Valid encounter within last 6 months    Recent Outpatient Visits          7 months ago Lab test positive for detection of COVID-19 virus   [facility] Steele Sizer, MD   9 months ago Hypertension, benign   [facility] Steele Sizer, MD   1 year ago Hypertension, benign   [facility] Steele Sizer, MD   1 year ago Hypertension, benign   [facility] Steele Sizer, MD   2 years ago Encounter for routine history and physical exam for male   [facility] Steele Sizer, MD

## 2019-08-25 ENCOUNTER — Other Ambulatory Visit: Payer: Self-pay | Admitting: Family Medicine

## 2019-08-25 DIAGNOSIS — F411 Generalized anxiety disorder: Secondary | ICD-10-CM

## 2019-08-25 DIAGNOSIS — F410 Panic disorder [episodic paroxysmal anxiety] without agoraphobia: Secondary | ICD-10-CM

## 2019-09-04 ENCOUNTER — Other Ambulatory Visit: Payer: Self-pay

## 2019-09-04 ENCOUNTER — Ambulatory Visit: Payer: BLUE CROSS/BLUE SHIELD | Admitting: Family Medicine

## 2019-09-04 ENCOUNTER — Encounter: Payer: Self-pay | Admitting: Family Medicine

## 2019-09-04 VITALS — BP 130/86 | HR 76 | Temp 97.9°F | Resp 16 | Ht 65.5 in | Wt 200.9 lb

## 2019-09-04 DIAGNOSIS — F411 Generalized anxiety disorder: Secondary | ICD-10-CM | POA: Diagnosis not present

## 2019-09-04 DIAGNOSIS — F410 Panic disorder [episodic paroxysmal anxiety] without agoraphobia: Secondary | ICD-10-CM

## 2019-09-04 DIAGNOSIS — K219 Gastro-esophageal reflux disease without esophagitis: Secondary | ICD-10-CM

## 2019-09-04 DIAGNOSIS — E785 Hyperlipidemia, unspecified: Secondary | ICD-10-CM

## 2019-09-04 DIAGNOSIS — I10 Essential (primary) hypertension: Secondary | ICD-10-CM

## 2019-09-04 MED ORDER — LOSARTAN POTASSIUM 25 MG PO TABS: 25.0000 mg | ORAL_TABLET | Freq: Every day | ORAL | 3 refills | Status: DC

## 2019-09-04 MED ORDER — FAMOTIDINE 40 MG PO TABS: 40.0000 mg | ORAL_TABLET | Freq: Every day | ORAL | 1 refills | Status: DC

## 2019-09-04 MED ORDER — CITALOPRAM HYDROBROMIDE 20 MG PO TABS: 20.0000 mg | ORAL_TABLET | Freq: Every day | ORAL | 3 refills | Status: DC

## 2019-09-04 NOTE — Progress Notes (Signed)
Name: Kevin Bernard   MRN: [id number]    DOB: Dec 18, 1965   Date:09/04/2019       Progress Note  Subjective  Chief Complaint  Chief Complaint  Patient presents with  . Gastroesophageal Reflux  . Hyperglycemia  . Anxiety  . Prediabetes    HPI  YPP:JKDT home has been 120/80's we switched him from Lisinopril 10 mg to Losartan 50 mg back in March 2020 because lisinopril was causing a dry cough,. He is now on lower dose because he was feeling dizzy on 50 mg.   Dyslipidemia: he is eating tree nuts but not enough fish, he works a lot around his house and yard, he will return for CPE and repeat labs soon   GAD: he tried Zoloft but did not control symptoms, responded to Prozac but it caused tremors, since 07/2016 he has been on Citalopram and is doing well.No side effects, helping with anxiety, score is zero. He is doing much better now   GERD: he is now on Famotidine  daily,and symptoms under control.He likes to eat spicy food and has flares intermittently but not very often with medication. He needs refills of medication   History of COVID: answered questions about vaccine he seemed pleased   Patient Active Problem List   Diagnosis Date Noted  . Pre-diabetes 04/16/2018  . Panic attack 05/20/2016  . Abnormal EKG 05/18/2016  . Anxiety about health 05/08/2016  . Internal hemorrhoids 12/29/2015  . Back pain, chronic 01/18/2015  . Gastro-esophageal reflux disease without esophagitis 01/18/2015  . Numerous moles 01/18/2015  . Hyperglycemia 01/18/2015  . History of fracture of femur 01/18/2015    Past Surgical History:  Procedure Laterality Date  . APPENDECTOMY    . right femur internal fixiation    . VASECTOMY      Family History  Problem Relation Age of Onset  . CVA Mother   . Cancer Father 49       Pancreatic  . Diabetes Father   . Depression Brother   . Depression Daughter   . Epilepsy Brother     Social History   Tobacco Use  . Smoking status: Former Smoker     Packs/day: 1.00    Years: 2.00    Pack years: 2.00    Quit date: 01/30/1988    Years since quitting: 31.6  . Smokeless tobacco: Never Used  Substance Use Topics  . Alcohol use: Yes    Alcohol/week: 5.0 standard drinks    Types: 5 Cans of beer per week     Current Outpatient Medications:  .  citalopram (CELEXA) 20 MG tablet, TAKE 1 TABLET BY MOUTH EVERY DAY, Disp: 30 tablet, Rfl: 0 .  famotidine (PEPCID) 40 MG tablet, Take 1 tablet (40 mg total) by mouth daily., Disp: 180 tablet, Rfl: 1 .  losartan (COZAAR) 25 MG tablet, TAKE 1 TABLET BY MOUTH EVERY DAY, Disp: 90 tablet, Rfl: 0 .  Multiple Vitamin (MULTIVITAMINS PO), Take 1 tablet by mouth daily., Disp: , Rfl:  .  azithromycin (ZITHROMAX) 250 MG tablet, Take as directed (Patient not taking: Reported on 09/04/2019), Disp: 6 tablet, Rfl: 0  Allergies  Allergen Reactions  . Ace Inhibitors Cough    I personally reviewed active problem list, medication list, allergies, family history, social history, health maintenance with the patient/caregiver today.   ROS  Constitutional: Negative for fever or weight change.  Respiratory: Negative for cough and shortness of breath.   Cardiovascular: Negative for chest pain or palpitations.  Gastrointestinal: Negative  for abdominal pain, no bowel changes.  Musculoskeletal: Negative for gait problem or joint swelling.  Skin: Negative for rash.  Neurological: Negative for dizziness or headache.  No other specific complaints in a complete review of systems (except as listed in HPI above).  Objective  Vitals:   09/04/19 1508  BP: 130/86  Pulse: 76  Resp: 16  Temp: 97.9 F (36.6 C)  TempSrc: Temporal  SpO2: 97%  Weight: 200 lb 14.4 oz (91.1 kg)  Height: 5' 5.5" (1.664 m)    Body mass index is 32.92 kg/m.  Physical Exam  Constitutional: Patient appears well-developed and well-nourished. Obese  No distress.  HEENT: head atraumatic, normocephalic, pupils equal and reactive to light,  neck  supple Cardiovascular: Normal rate, regular rhythm and normal heart sounds.  No murmur heard. No BLE edema. Pulmonary/Chest: Effort normal and breath sounds normal. No respiratory distress. Abdominal: Soft.  There is no tenderness. Psychiatric: Patient has a normal mood and affect. behavior is normal. Judgment and thought content normal.  PHQ2/9: Depression screen [facility] 2/9 12/16/2018 10/15/2018 04/14/2018 10/11/2017 04/09/2017  Decreased Interest 0 0 0 0 0  Down, Depressed, Hopeless 0 0 0 0 0  PHQ - 2 Score 0 0 0 0 0  Altered sleeping 0 1 0 2 0  Tired, decreased energy 0 0 0 2 0  Change in appetite 0 0 0 0 0  Feeling bad or failure about yourself  0 0 0 0 0  Trouble concentrating 0 0 0 0 0  Moving slowly or fidgety/restless 0 0 0 0 0  Suicidal thoughts 0 0 0 0 0  PHQ-9 Score 0 1 0 4 0  Difficult doing work/chores Not difficult at all Not difficult at all Not difficult at all Not difficult at all Not difficult at all    phq 9 is negative   Fall Risk: Fall Risk  09/04/2019 12/16/2018 10/15/2018 04/14/2018 10/11/2017  Falls in the past year? 0 0 0 0 No  Number falls in past yr: 0 0 0 0 -  Injury with Fall? 0 0 0 0 -     Assessment & Plan  1. Hypertension, benign  - losartan (COZAAR) 25 MG tablet; Take 1 tablet (25 mg total) by mouth daily.  Dispense: 90 tablet; Refill: 3  2. GAD (generalized anxiety disorder)  - citalopram (CELEXA) 20 MG tablet; Take 1 tablet (20 mg total) by mouth daily.  Dispense: 90 tablet; Refill: 3  3. Dyslipidemia  Recheck labs during CPE  4. Gastro-esophageal reflux disease without esophagitis  - famotidine (PEPCID) 40 MG tablet; Take 1 tablet (40 mg total) by mouth daily.  Dispense: 180 tablet; Refill: 1  5. Panic attack  - citalopram (CELEXA) 20 MG tablet; Take 1 tablet (20 mg total) by mouth daily.  Dispense: 90 tablet; Refill: 3

## 2019-09-10 ENCOUNTER — Encounter: Payer: Self-pay | Admitting: Family Medicine

## 2019-09-12 ENCOUNTER — Other Ambulatory Visit: Payer: Self-pay | Admitting: Family Medicine

## 2019-09-12 DIAGNOSIS — Z8616 Personal history of COVID-19: Secondary | ICD-10-CM

## 2019-09-14 DIAGNOSIS — Z8616 Personal history of COVID-19: Secondary | ICD-10-CM | POA: Diagnosis not present

## 2019-09-15 ENCOUNTER — Encounter: Payer: Self-pay | Admitting: Family Medicine

## 2019-09-15 LAB — SARS-COV-2 SEMI-QUANTITATIVE TOTAL ANTIBODY, SPIKE
SARS-CoV-2 Semi-Quant Total Ab: 55.4 U/mL (ref <0.8)
SARS-CoV-2 Spike Ab Interp: POSITIVE

## 2019-11-24 ENCOUNTER — Ambulatory Visit (INDEPENDENT_AMBULATORY_CARE_PROVIDER_SITE_OTHER): Payer: BLUE CROSS/BLUE SHIELD | Admitting: Family Medicine

## 2019-11-24 ENCOUNTER — Other Ambulatory Visit: Payer: Self-pay

## 2019-11-24 ENCOUNTER — Encounter: Payer: Self-pay | Admitting: Family Medicine

## 2019-11-24 VITALS — BP 140/82 | HR 80 | Temp 97.9°F | Resp 16 | Ht 66.0 in | Wt 200.0 lb

## 2019-11-24 DIAGNOSIS — R972 Elevated prostate specific antigen [PSA]: Secondary | ICD-10-CM | POA: Diagnosis not present

## 2019-11-24 DIAGNOSIS — E785 Hyperlipidemia, unspecified: Secondary | ICD-10-CM | POA: Diagnosis not present

## 2019-11-24 DIAGNOSIS — R7303 Prediabetes: Secondary | ICD-10-CM

## 2019-11-24 DIAGNOSIS — Z23 Encounter for immunization: Secondary | ICD-10-CM | POA: Diagnosis not present

## 2019-11-24 DIAGNOSIS — Z125 Encounter for screening for malignant neoplasm of prostate: Secondary | ICD-10-CM

## 2019-11-24 DIAGNOSIS — Z1211 Encounter for screening for malignant neoplasm of colon: Secondary | ICD-10-CM

## 2019-11-24 DIAGNOSIS — Z0000 Encounter for general adult medical examination without abnormal findings: Secondary | ICD-10-CM | POA: Diagnosis not present

## 2019-11-24 DIAGNOSIS — Z8616 Personal history of COVID-19: Secondary | ICD-10-CM

## 2019-11-24 NOTE — Patient Instructions (Signed)
Preventive Care 87-54 Years Old, Male Preventive care refers to lifestyle choices and visits with your health care provider that can promote health and wellness. This includes:  A yearly physical exam. This is also called an annual well check.  Regular dental and eye exams.  Immunizations.  Screening for certain conditions.  Healthy lifestyle choices, such as eating a healthy diet, getting regular exercise, not using drugs or products that contain nicotine and tobacco, and limiting alcohol use. What can I expect for my preventive care visit? Physical exam Your health care provider will check:  Height and weight. These may be used to calculate body mass index (BMI), which is a measurement that tells if you are at a healthy weight.  Heart rate and blood pressure.  Your skin for abnormal spots. Counseling Your health care provider may ask you questions about:  Alcohol, tobacco, and drug use.  Emotional well-being.  Home and relationship well-being.  Sexual activity.  Eating habits.  Work and work Statistician. What immunizations do I need?  Influenza (flu) vaccine  This is recommended every year. Tetanus, diphtheria, and pertussis (Tdap) vaccine  You may need a Td booster every 10 years. Varicella (chickenpox) vaccine  You may need this vaccine if you have not already been vaccinated. Zoster (shingles) vaccine  You may need this after age 82. Measles, mumps, and rubella (MMR) vaccine  You may need at least one dose of MMR if you were born in 1957 or later. You may also need a second dose. Pneumococcal conjugate (PCV13) vaccine  You may need this if you have certain conditions and were not previously vaccinated. Pneumococcal polysaccharide (PPSV23) vaccine  You may need one or two doses if you smoke cigarettes or if you have certain conditions. Meningococcal conjugate (MenACWY) vaccine  You may need this if you have certain conditions. Hepatitis A  vaccine  You may need this if you have certain conditions or if you travel or work in places where you may be exposed to hepatitis A. Hepatitis B vaccine  You may need this if you have certain conditions or if you travel or work in places where you may be exposed to hepatitis B. Haemophilus influenzae type b (Hib) vaccine  You may need this if you have certain risk factors. Human papillomavirus (HPV) vaccine  If recommended by your health care provider, you may need three doses over 6 months. You may receive vaccines as individual doses or as more than one vaccine together in one shot (combination vaccines). Talk with your health care provider about the risks and benefits of combination vaccines. What tests do I need? Blood tests  Lipid and cholesterol levels. These may be checked every 5 years, or more frequently if you are over 70 years old.  Hepatitis C test.  Hepatitis B test. Screening  Lung cancer screening. You may have this screening every year starting at age 61 if you have a 30-pack-year history of smoking and currently smoke or have quit within the past 15 years.  Prostate cancer screening. Recommendations will vary depending on your family history and other risks.  Colorectal cancer screening. All adults should have this screening starting at age 82 and continuing until age 74. Your health care provider may recommend screening at age 37 if you are at increased risk. You will have tests every 1-10 years, depending on your results and the type of screening test.  Diabetes screening. This is done by checking your blood sugar (glucose) after you have not eaten  for a while (fasting). You may have this done every 1-3 years.  Sexually transmitted disease (STD) testing. Follow these instructions at home: Eating and drinking  Eat a diet that includes fresh fruits and vegetables, whole grains, lean protein, and low-fat dairy products.  Take vitamin and mineral supplements as  recommended by your health care provider.  Do not drink alcohol if your health care provider tells you not to drink.  If you drink alcohol: ? Limit how much you have to 0-2 drinks a day. ? Be aware of how much alcohol is in your drink. In the [place] one drink equals one 12 oz bottle of beer (355 mL), one 5 oz glass of wine (148 mL), or one 1 oz glass of hard liquor (44 mL). Lifestyle  Take daily care of your teeth and gums.  Stay active. Exercise for at least 30 minutes on 5 or more days each week.  Do not use any products that contain nicotine or tobacco, such as cigarettes, e-cigarettes, and chewing tobacco. If you need help quitting, ask your health care provider.  If you are sexually active, practice safe sex. Use a condom or other form of protection to prevent STIs (sexually transmitted infections).  Talk with your health care provider about taking a low-dose aspirin every day starting at age 53. What's next?  Go to your health care provider once a year for a well check visit.  Ask your health care provider how often you should have your eyes and teeth checked.  Stay up to date on all vaccines. This information is not intended to replace advice given to you by your health care provider. Make sure you discuss any questions you have with your health care provider. Document Revised: 01/09/2018 Document Reviewed: 01/09/2018 Elsevier Patient Education  2020 [employer].

## 2019-11-24 NOTE — Progress Notes (Signed)
Name: Kevin Bernard   MRN: [id number]    DOB: [birth year]   Date:11/24/2019       Progress Note  Subjective  Chief Complaint  CPE  HPI  Patient presents for annual CPE   IPSS Questionnaire (AUA-7): Over the past month.   1)  How often have you had a sensation of not emptying your bladder completely after you finish urinating?  0 - Not at all  2)  How often have you had to urinate again less than two hours after you finished urinating? 0 - Not at all  3)  How often have you found you stopped and started again several times when you urinated?  0 - Not at all  4) How difficult have you found it to postpone urination?  0 - Not at all  5) How often have you had a weak urinary stream?  0 - Not at all  6) How often have you had to push or strain to begin urination?  0 - Not at all  7) How many times did you most typically get up to urinate from the time you went to bed until the time you got up in the morning?  0 - None  Total score:  0-7 mildly symptomatic   8-19 moderately symptomatic   20-35 severely symptomatic     Diet: their children are no longer at home and they are eating more salads, lighter and smaller meals  Exercise: active maintaining his house, big property , he does the yard   Depression: phq 9 is negative Depression screen [facility] 2/9 11/24/2019 12/16/2018 10/15/2018 04/14/2018 10/11/2017  Decreased Interest 0 0 0 0 0  Down, Depressed, Hopeless 0 0 0 0 0  PHQ - 2 Score 0 0 0 0 0  Altered sleeping 0 0 1 0 2  Tired, decreased energy 0 0 0 0 2  Change in appetite 0 0 0 0 0  Feeling bad or failure about yourself  0 0 0 0 0  Trouble concentrating 0 0 0 0 0  Moving slowly or fidgety/restless 0 0 0 0 0  Suicidal thoughts 0 0 0 0 0  PHQ-9 Score 0 0 1 0 4  Difficult doing work/chores - Not difficult at all Not difficult at all Not difficult at all Not difficult at all    Hypertension:  BP Readings from Last 3 Encounters:  11/24/19 140/82  09/04/19 130/86  04/14/18 124/90     Obesity: Wt Readings from Last 3 Encounters:  11/24/19 200 lb (90.7 kg)  09/04/19 200 lb 14.4 oz (91.1 kg)  10/15/18 190 lb (86.2 kg)   BMI Readings from Last 3 Encounters:  11/24/19 32.28 kg/m  09/04/19 32.92 kg/m  10/15/18 31.14 kg/m     Lipids:  Lab Results  Component Value Date   CHOL 210 (H) 04/14/2018   CHOL 201 (A) 09/11/2017   CHOL 191 04/09/2017   Lab Results  Component Value Date   HDL 39 (L) 04/14/2018   HDL 30 (A) 09/11/2017   HDL 40 (L) 04/09/2017   Lab Results  Component Value Date   LDLCALC 142 (H) 04/14/2018   LDLCALC 148 09/11/2017   LDLCALC 121 (H) 04/09/2017   Lab Results  Component Value Date   TRIG 155 (H) 04/14/2018   TRIG 114 09/11/2017   TRIG 183 (H) 04/09/2017   Lab Results  Component Value Date   CHOLHDL 5.4 (H) 04/14/2018   CHOLHDL 4.8 04/09/2017   CHOLHDL 5.4 (H) 12/29/2015  No results found for: LDLDIRECT Glucose:  Glucose  Date Value Ref Range Status  11/07/2017 84 65 - 99 mg/dL Final   Glucose, Bld  Date Value Ref Range Status  04/14/2018 85 65 - 99 mg/dL Final    Comment:    .            Fasting reference interval .   04/09/2017 96 65 - 139 mg/dL Final    Comment:    .        Non-fasting reference interval .   05/12/2016 120 (H) 65 - 99 mg/dL Final      Office Visit from 11/24/2019 in [facility]  AUDIT-C Score 0      Married STD testing and prevention (HIV/chl/gon/syphilis): not interested  Hep C: 04/14/2018  Skin cancer: Discussed monitoring for atypical lesions Colorectal cancer: ordered at visit today Prostate cancer:  Lab Results  Component Value Date   PSA 3.1 04/14/2018   PSA 1.3 04/09/2017   PSA 1.7 12/29/2015     Lung cancer:   Low Dose CT Chest recommended if Age 43-80 years, 30 pack-year currently smoking OR have quit w/in 15years. Patient does not qualify.   ECG:  05/14/2016  Vaccines:   Shingrix: 30-64 yo and ask insurance if covered when patient above  52 yo Pneumonia:  educated and discussed with patient. Flu: Declined educated and discussed with patient.  Advanced Care Planning: A voluntary discussion about advance care planning including the explanation and discussion of advance directives.  Discussed health care proxy and Living will, and the patient was able to identify a health care proxy as wife  Patient does not have a living will at present time.  Patient Active Problem List   Diagnosis Date Noted  . Pre-diabetes 04/16/2018  . Panic attack 05/20/2016  . Abnormal EKG 05/18/2016  . Anxiety about health 05/08/2016  . Internal hemorrhoids 12/29/2015  . Back pain, chronic 01/18/2015  . Gastro-esophageal reflux disease without esophagitis 01/18/2015  . Numerous moles 01/18/2015  . Hyperglycemia 01/18/2015  . History of fracture of femur 01/18/2015    Past Surgical History:  Procedure Laterality Date  . APPENDECTOMY    . right femur internal fixiation    . VASECTOMY      Family History  Problem Relation Age of Onset  . CVA Mother   . Cancer Father 34       Pancreatic  . Diabetes Father   . Depression Brother   . Depression Daughter   . Epilepsy Brother     Social History   Socioeconomic History  . Marital status: Married    Spouse name: Abigail Butts  . Number of children: 4  . Years of education: Not on file  . Highest education level: Some college, no degree  Occupational History  . Occupation: techinal specialist     Employer: [employer]  Tobacco Use  . Smoking status: Former Smoker    Packs/day: 1.00    Years: 2.00    Pack years: 2.00    Quit date: 01/30/1988    Years since quitting: 31.8  . Smokeless tobacco: Never Used  Vaping Use  . Vaping Use: Never used  Substance and Sexual Activity  . Alcohol use: Yes    Alcohol/week: 5.0 standard drinks    Types: 5 Cans of beer per week  . Drug use: No  . Sexual activity: Yes    Partners: Female    Birth control/protection: None  Other Topics Concern  .  Not on file  Social History Narrative   Married, they have 4 children.    Social Determinants of Health   Financial Resource Strain: Low Risk   . Difficulty of Paying Living Expenses: Not hard at all  Food Insecurity: No Food Insecurity  . Worried About Charity fundraiser in the Last Year: Never true  . Ran Out of Food in the Last Year: Never true  Transportation Needs: No Transportation Needs  . Lack of Transportation (Medical): No  . Lack of Transportation (Non-Medical): No  Physical Activity: Insufficiently Active  . Days of Exercise per Week: 2 days  . Minutes of Exercise per Session: 30 min  Stress: No Stress Concern Present  . Feeling of Stress : Not at all  Social Connections: Moderately Integrated  . Frequency of Communication with Friends and Family: More than three times a week  . Frequency of Social Gatherings with Friends and Family: Three times a week  . Attends Religious Services: 1 to 4 times per year  . Active Member of Clubs or Organizations: No  . Attends Archivist Meetings: Not on file  . Marital Status: Married  Human resources officer Violence: Not At Risk  . Fear of Current or Ex-Partner: No  . Emotionally Abused: No  . Physically Abused: No  . Sexually Abused: No     Current Outpatient Medications:  .  citalopram (CELEXA) 20 MG tablet, Take 1 tablet (20 mg total) by mouth daily., Disp: 90 tablet, Rfl: 3 .  famotidine (PEPCID) 40 MG tablet, Take 1 tablet (40 mg total) by mouth daily., Disp: 180 tablet, Rfl: 1 .  losartan (COZAAR) 25 MG tablet, Take 1 tablet (25 mg total) by mouth daily., Disp: 90 tablet, Rfl: 3 .  Multiple Vitamin (MULTIVITAMINS PO), Take 1 tablet by mouth daily., Disp: , Rfl:   Allergies  Allergen Reactions  . Ace Inhibitors Cough     ROS  Constitutional: Negative for fever or weight change.  Respiratory: Negative for cough and shortness of breath.   Cardiovascular: Negative for chest pain or palpitations.   Gastrointestinal: Negative for abdominal pain, no bowel changes.  Musculoskeletal: Negative for gait problem or joint swelling.  Skin: Negative for rash.  Neurological: Negative for dizziness or headache.  No other specific complaints in a complete review of systems (except as listed in HPI above).  Objective  Vitals:   11/24/19 1514  BP: 140/82  Pulse: 80  Resp: 16  Temp: 97.9 F (36.6 C)  TempSrc: Oral  SpO2: 98%  Weight: 200 lb (90.7 kg)  Height: 5\' 6"  (1.676 m)    Body mass index is 32.28 kg/m.  Physical Exam  Constitutional: Patient appears well-developed and well-nourished. No distress.  HENT: Head: Normocephalic and atraumatic. Ears: B TMs ok, no erythema or effusion; Nose: Nose normal. Mouth/Throat: Oropharynx is clear and moist. No oropharyngeal exudate.  Eyes: Conjunctivae and EOM are normal. Pupils are equal, round, and reactive to light. No scleral icterus.  Neck: Normal range of motion. Neck supple. No JVD present. No thyromegaly present.  Cardiovascular: Normal rate, regular rhythm and normal heart sounds.  No murmur heard. No BLE edema. Pulmonary/Chest: Effort normal and breath sounds normal. No respiratory distress. Abdominal: Soft. Bowel sounds are normal, no distension. There is no tenderness. no masses MALE GENITALIA: Normal descended testes bilaterally, palpable varicocele on left side no hernias, no lesions, no discharge RECTAL: Prostate normal size and consistency, no rectal masses or hemorrhoids  Musculoskeletal: Normal range of motion,  no joint effusions. No gross deformities Neurological: he is alert and oriented to person, place, and time. No cranial nerve deficit. Coordination, balance, strength, speech and gait are normal.  Skin: Skin is warm and dry. No rash noted. No erythema.  Psychiatric: Patient has a normal mood and affect. behavior is normal. Judgment and thought content normal.  Recent Results (from the past 2160 hour(s))  SARS-CoV-2  Semi-Quantitative Total Antibody, Spike     Status: None   Collection Time: 09/14/19  4:06 PM  Result Value Ref Range   SARS-CoV-2 Semi-Quant Total Ab 55.4 Negative<0.8 U/mL    Comment: Antibodies against the SARS-CoV-2 spike protein receptor binding domain (RBD) were detected. It is yet undetermined what level of antibody to SARS-CoV-2 spike protein correlates to immunity against developing symptomatic SARS-CoV-2 disease. Studies are underway to measure the quantitative levels of specific SARS-CoV-2 antibodies following vaccination. Such studies will provide valuable insights into the correlation between protection from vaccination and antibody levels.    SARS-CoV-2 Spike Ab Interp Positive     Comment: Roche Elecsys Anti-SARS-CoV-2 S     Fall Risk: Fall Risk  11/24/2019 11/24/2019 09/04/2019 12/16/2018 10/15/2018  Falls in the past year? 0 0 0 0 0  Number falls in past yr: 0 0 0 0 0  Injury with Fall? 1 0 0 0 0      Functional Status Survey: Is the patient deaf or have difficulty hearing?: No Does the patient have difficulty seeing, even when wearing glasses/contacts?: No Does the patient have difficulty concentrating, remembering, or making decisions?: No Does the patient have difficulty walking or climbing stairs?: No Does the patient have difficulty dressing or bathing?: No Does the patient have difficulty doing errands alone such as visiting a doctor's office or shopping?: No   Assessment & Plan  1. Prostate cancer screening   2. Colon cancer screening  - Ambulatory referral to Gastroenterology  3. Annual physical exam  - CBC with Differential - Lipid Panel With LDL/HDL Ratio - COMPLETE METABOLIC PANEL WITH GFR  4. Dyslipidemia  - Lipid Panel With LDL/HDL Ratio  5. Pre-diabetes  - Hemoglobin A1c  6. PSA elevation  - PSA  7. History of COVID-19  He is doing a study and would like to have test repeated - free through [facility]  - SARS-CoV-2  Semi-Quantitative Total Antibody, Spike  8. Need for shingles vaccine  - Varicella-zoster vaccine IM  -Prostate cancer screening and PSA options (with potential risks and benefits of testing vs not testing) were discussed along with recent recs/guidelines. -USPSTF grade A and B recommendations reviewed with patient; age-appropriate recommendations, preventive care, screening tests, etc discussed and encouraged; healthy living encouraged; see AVS for patient education given to patient -Discussed importance of 150 minutes of physical activity weekly, eat two servings of fish weekly, eat one serving of tree nuts ( cashews, pistachios, pecans, almonds.Marland Kitchen) every other day, eat 6 servings of fruit/vegetables daily and drink plenty of water and avoid sweet beverages.

## 2019-11-25 LAB — LIPID PANEL
Cholesterol: 209 mg/dL — ABNORMAL HIGH (ref <200)
HDL: 41 mg/dL (ref >=40)
LDL Cholesterol (Calc): 125 mg/dL (calc) — ABNORMAL HIGH
Non-HDL Cholesterol (Calc): 168 mg/dL (calc) — ABNORMAL HIGH (ref <130)
Total CHOL/HDL Ratio: 5.1 (calc) — ABNORMAL HIGH (ref <5.0)
Triglycerides: 298 mg/dL — ABNORMAL HIGH (ref <150)

## 2019-11-25 LAB — HEMOGLOBIN A1C
Hgb A1c MFr Bld: 5.7 % of total Hgb — ABNORMAL HIGH (ref <5.7)
Mean Plasma Glucose: 117 (calc)
eAG (mmol/L): 6.5 (calc)

## 2019-11-25 LAB — COMPLETE METABOLIC PANEL WITH GFR
AG Ratio: 1.9 (calc) (ref 1.0–2.5)
ALT: 25 U/L (ref 9–46)
AST: 19 U/L (ref 10–35)
Albumin: 4.3 g/dL (ref 3.6–5.1)
Alkaline phosphatase (APISO): 88 U/L (ref 35–144)
BUN: 19 mg/dL (ref 7–25)
CO2: 27 mmol/L (ref 20–32)
Calcium: 9.6 mg/dL (ref 8.6–10.3)
Chloride: 105 mmol/L (ref 98–110)
Creat: 1.12 mg/dL (ref 0.70–1.33)
GFR, Est African American: 86 mL/min/{1.73_m2} (ref >=60)
GFR, Est Non African American: 75 mL/min/{1.73_m2} (ref >=60)
Globulin: 2.3 g/dL (calc) (ref 1.9–3.7)
Glucose, Bld: 79 mg/dL (ref 65–99)
Potassium: 4.2 mmol/L (ref 3.5–5.3)
Sodium: 139 mmol/L (ref 135–146)
Total Bilirubin: 0.5 mg/dL (ref 0.2–1.2)
Total Protein: 6.6 g/dL (ref 6.1–8.1)

## 2019-11-25 LAB — CBC WITH DIFFERENTIAL/PLATELET
Absolute Monocytes: 756 cells/uL (ref 200–950)
Basophils Absolute: 58 cells/uL (ref 0–200)
Basophils Relative: 0.8 %
Eosinophils Absolute: 108 cells/uL (ref 15–500)
Eosinophils Relative: 1.5 %
HCT: 46.3 % (ref 38.5–50.0)
Hemoglobin: 15.6 g/dL (ref 13.2–17.1)
Lymphs Abs: 2160 cells/uL (ref 850–3900)
MCH: 30 pg (ref 27.0–33.0)
MCHC: 33.7 g/dL (ref 32.0–36.0)
MCV: 89 fL (ref 80.0–100.0)
MPV: 11.4 fL (ref 7.5–12.5)
Monocytes Relative: 10.5 %
Neutro Abs: 4118 cells/uL (ref 1500–7800)
Neutrophils Relative %: 57.2 %
Platelets: 224 10*3/uL (ref 140–400)
RBC: 5.2 10*6/uL (ref 4.20–5.80)
RDW: 12.2 % (ref 11.0–15.0)
Total Lymphocyte: 30 %
WBC: 7.2 10*3/uL (ref 3.8–10.8)

## 2019-11-25 LAB — PSA: PSA: 1.6 ng/mL (ref <=4.0)

## 2019-12-04 DIAGNOSIS — Z8616 Personal history of COVID-19: Secondary | ICD-10-CM | POA: Diagnosis not present

## 2019-12-05 LAB — SARS-COV-2 SEMI-QUANTITATIVE TOTAL ANTIBODY, SPIKE
SARS-CoV-2 Semi-Quant Total Ab: >2500 U/mL (ref <0.8)
SARS-CoV-2 Spike Ab Interp: POSITIVE

## 2019-12-07 ENCOUNTER — Other Ambulatory Visit: Payer: Self-pay

## 2019-12-07 ENCOUNTER — Telehealth (INDEPENDENT_AMBULATORY_CARE_PROVIDER_SITE_OTHER): Payer: Self-pay | Admitting: Gastroenterology

## 2019-12-07 DIAGNOSIS — Z1211 Encounter for screening for malignant neoplasm of colon: Secondary | ICD-10-CM

## 2019-12-07 MED ORDER — PEG 3350-KCL-NA BICARB-NACL 420 G PO SOLR: 4000.0000 mL | Freq: Once | ORAL | 0 refills | Status: AC

## 2019-12-07 NOTE — Progress Notes (Signed)
Gastroenterology Pre-Procedure Review  Request Date: Monday 01/18/20 Requesting Physician: Dr. Marius Ditch  PATIENT REVIEW QUESTIONS: The patient responded to the following health history questions as indicated:    1. Are you having any GI issues? yes (patient states he has hemorrhoids.  He inquired whether hemorrhoid could be removed during colonoscopy.  Patient was advised that he would need an office visit to discuss, and was asked if he wanted to make an appt to discuss.  Pt declined an appt to discuss hemorrhoids.) 2. Do you have a personal history of Polyps? no 3. Do you have a family history of Colon Cancer or Polyps? no 4. Diabetes Mellitus? no 5. Joint replacements in the past 12 months?no 6. Major health problems in the past 3 months?no 7. Any artificial heart valves, MVP, or defibrillator?no    MEDICATIONS & ALLERGIES:    Patient reports the following regarding taking any anticoagulation/antiplatelet therapy:   Plavix, Coumadin, Eliquis, Xarelto, Lovenox, Pradaxa, Brilinta, or Effient? no Aspirin? no  Patient confirms/reports the following medications:  Current Outpatient Medications  Medication Sig Dispense Refill  . citalopram (CELEXA) 20 MG tablet Take 1 tablet (20 mg total) by mouth daily. 90 tablet 3  . famotidine (PEPCID) 40 MG tablet Take 1 tablet (40 mg total) by mouth daily. 180 tablet 1  . losartan (COZAAR) 25 MG tablet Take 1 tablet (25 mg total) by mouth daily. 90 tablet 3  . Multiple Vitamin (MULTIVITAMINS PO) Take 1 tablet by mouth daily.    . polyethylene glycol-electrolytes (NULYTELY) 420 g solution Take 4,000 mLs by mouth once for 1 dose. 4000 mL 0   No current facility-administered medications for this visit.    Patient confirms/reports the following allergies:  Allergies  Allergen Reactions  . Ace Inhibitors Cough    No orders of the defined types were placed in this encounter.   AUTHORIZATION INFORMATION Primary Insurance: 1D#: Group  #:  Secondary Insurance: 1D#: Group #:  SCHEDULE INFORMATION: Date: Monday 01/18/20 Time: Location:[facility]

## 2020-01-11 ENCOUNTER — Encounter: Payer: Self-pay | Admitting: Family Medicine

## 2020-01-14 ENCOUNTER — Other Ambulatory Visit: Payer: Self-pay

## 2020-01-14 ENCOUNTER — Other Ambulatory Visit
Admission: RE | Admit: 2020-01-14 | Discharge: 2020-01-14 | Disposition: A | Discharge status: Home / Self Care | Payer: BLUE CROSS/BLUE SHIELD | Source: Ambulatory Visit | Attending: Gastroenterology | Admitting: Gastroenterology

## 2020-01-14 DIAGNOSIS — Z01812 Encounter for preprocedural laboratory examination: Secondary | ICD-10-CM | POA: Diagnosis not present

## 2020-01-14 DIAGNOSIS — Z20822 Contact with and (suspected) exposure to covid-19: Secondary | ICD-10-CM | POA: Diagnosis not present

## 2020-01-14 LAB — SARS CORONAVIRUS 2 (TAT 6-24 HRS): SARS Coronavirus 2: NEGATIVE

## 2020-01-15 ENCOUNTER — Encounter: Payer: Self-pay | Admitting: Gastroenterology

## 2020-01-18 ENCOUNTER — Ambulatory Visit: Payer: BLUE CROSS/BLUE SHIELD | Admitting: Certified Registered Nurse Anesthetist

## 2020-01-18 ENCOUNTER — Ambulatory Visit
Admission: RE | Admit: 2020-01-18 | Discharge: 2020-01-18 | Disposition: A | Discharge status: Home / Self Care | Payer: BLUE CROSS/BLUE SHIELD | Attending: Gastroenterology | Admitting: Gastroenterology

## 2020-01-18 ENCOUNTER — Encounter: Payer: Self-pay | Admitting: Gastroenterology

## 2020-01-18 ENCOUNTER — Other Ambulatory Visit: Payer: Self-pay

## 2020-01-18 ENCOUNTER — Encounter
Admission: RE | Disposition: A | Discharge status: Home / Self Care | Payer: Self-pay | Source: Home / Self Care | Attending: Gastroenterology

## 2020-01-18 DIAGNOSIS — K635 Polyp of colon: Secondary | ICD-10-CM | POA: Insufficient documentation

## 2020-01-18 DIAGNOSIS — Z87891 Personal history of nicotine dependence: Secondary | ICD-10-CM | POA: Diagnosis not present

## 2020-01-18 DIAGNOSIS — K644 Residual hemorrhoidal skin tags: Secondary | ICD-10-CM | POA: Diagnosis not present

## 2020-01-18 DIAGNOSIS — Z79899 Other long term (current) drug therapy: Secondary | ICD-10-CM | POA: Diagnosis not present

## 2020-01-18 DIAGNOSIS — Z1211 Encounter for screening for malignant neoplasm of colon: Secondary | ICD-10-CM | POA: Diagnosis not present

## 2020-01-18 DIAGNOSIS — D123 Benign neoplasm of transverse colon: Secondary | ICD-10-CM | POA: Insufficient documentation

## 2020-01-18 DIAGNOSIS — D120 Benign neoplasm of cecum: Secondary | ICD-10-CM | POA: Diagnosis not present

## 2020-01-18 DIAGNOSIS — D122 Benign neoplasm of ascending colon: Secondary | ICD-10-CM | POA: Diagnosis not present

## 2020-01-18 HISTORY — DX: Essential (primary) hypertension: I10

## 2020-01-18 HISTORY — PX: COLONOSCOPY WITH PROPOFOL: SHX5780

## 2020-01-18 SURGERY — COLONOSCOPY WITH PROPOFOL
Anesthesia: General

## 2020-01-18 MED ORDER — LIDOCAINE HCL (CARDIAC) PF 100 MG/5ML IV SOSY
PREFILLED_SYRINGE | INTRAVENOUS | Status: DC | PRN
  Administered 2020-01-18: 25 mg via INTRAVENOUS

## 2020-01-18 MED ORDER — SODIUM CHLORIDE 0.9 % IV SOLN: INTRAVENOUS | Status: DC

## 2020-01-18 MED ORDER — PROPOFOL 500 MG/50ML IV EMUL
INTRAVENOUS | Status: DC | PRN
  Administered 2020-01-18: 125 ug/kg/min via INTRAVENOUS

## 2020-01-18 MED ORDER — PROPOFOL 10 MG/ML IV BOLUS
INTRAVENOUS | Status: DC | PRN
  Administered 2020-01-18: 10 mg via INTRAVENOUS
  Administered 2020-01-18: 60 mg via INTRAVENOUS

## 2020-01-18 MED ORDER — PROPOFOL 500 MG/50ML IV EMUL
INTRAVENOUS | Status: AC
  Filled 2020-01-18: qty 50

## 2020-01-18 MED ORDER — LIDOCAINE HCL (PF) 2 % IJ SOLN
INTRAMUSCULAR | Status: AC
  Filled 2020-01-18: qty 5

## 2020-01-18 NOTE — Anesthesia Procedure Notes (Signed)
Procedure Name: MAC Date/Time: 01/18/2020 8:10 AM Performed by: Jerrye Noble, CRNA Pre-anesthesia Checklist: Patient identified, Emergency Drugs available, Suction available and Patient being monitored Oxygen Delivery Method: Nasal cannula

## 2020-01-18 NOTE — Anesthesia Preprocedure Evaluation (Addendum)
Anesthesia Evaluation  Patient identified by MRN, date of birth, ID band Patient awake    Reviewed: Allergy & Precautions, H&P , NPO status , Patient's Chart, lab work & pertinent test results  History of Anesthesia Complications Negative for: history of anesthetic complications  Airway Mallampati: III  TM Distance: >3 FB     Dental  (+) Teeth Intact   Pulmonary sleep apnea (+snoring. has not been evaluated for OSA) , neg COPD, former smoker,    breath sounds clear to auscultation       Cardiovascular hypertension, (-) angina(-) Past MI and (-) Cardiac Stents (-) dysrhythmias  Rhythm:regular Rate:Normal     Neuro/Psych Seizures - (as a child),  PSYCHIATRIC DISORDERS Anxiety    GI/Hepatic Neg liver ROS, GERD  Controlled,  Endo/Other  negative endocrine ROS  Renal/GU negative Renal ROS  negative genitourinary   Musculoskeletal   Abdominal   Peds  Hematology negative hematology ROS (+)   Anesthesia Other Findings Past Medical History: No date: Anxiety No date: Chronic back pain No date: Elevated blood pressure No date: GERD (gastroesophageal reflux disease) No date: Hypertension No date: Numerous moles No date: Pre-diabetes No date: Seizures (HCC) No date: Vasovagal syncope  Past Surgical History: No date: APPENDECTOMY No date: right femur internal fixiation No date: VASECTOMY  BMI    Body Mass Index: 31.47 kg/m      Reproductive/Obstetrics negative OB ROS                            Anesthesia Physical Anesthesia Plan  ASA: II  Anesthesia Plan: General   Post-op Pain Management:    Induction:   PONV Risk Score and Plan: Propofol infusion and TIVA  Airway Management Planned: Nasal Cannula  Additional Equipment:   Intra-op Plan:   Post-operative Plan:   Informed Consent: I have reviewed the patients History and Physical, chart, labs and discussed the procedure  including the risks, benefits and alternatives for the proposed anesthesia with the patient or authorized representative who has indicated his/her understanding and acceptance.     Dental Advisory Given  Plan Discussed with: Anesthesiologist, CRNA and Surgeon  Anesthesia Plan Comments:         Anesthesia Quick Evaluation

## 2020-01-18 NOTE — H&P (Signed)
Cephas Darby, MD [address]  [place], [place] [address]  Main: [phone]  Fax: [phone] Pager: [phone]  Primary Care Physician:  Steele Sizer, MD Primary Gastroenterologist:  Dr. Cephas Darby  Pre-Procedure History & Physical: HPI:  Kevin Bernard is a 54 y.o. male is here for an colonoscopy.   Past Medical History:  Diagnosis Date  . Anxiety   . Chronic back pain   . Elevated blood pressure   . GERD (gastroesophageal reflux disease)   . Hypertension   . Numerous moles   . Pre-diabetes   . Seizures ([place])   . Vasovagal syncope     Past Surgical History:  Procedure Laterality Date  . APPENDECTOMY    . right femur internal fixiation    . VASECTOMY      Prior to Admission medications   Medication Sig Start Date End Date Taking? Authorizing Provider  citalopram (CELEXA) 20 MG tablet Take 1 tablet (20 mg total) by mouth daily. 09/04/19  Yes Sowles, Drue Stager, MD  famotidine (PEPCID) 40 MG tablet Take 1 tablet (40 mg total) by mouth daily. 09/04/19  Yes Sowles, Drue Stager, MD  losartan (COZAAR) 25 MG tablet Take 1 tablet (25 mg total) by mouth daily. 09/04/19  Yes Sowles, Drue Stager, MD  Multiple Vitamin (MULTIVITAMINS PO) Take 1 tablet by mouth daily.   Yes [provider]    Allergies as of 12/08/2019 - Review Complete 12/07/2019  Allergen Reaction Noted  . Ace inhibitors Cough 04/14/2018    Family History  Problem Relation Age of Onset  . CVA Mother   . Cancer Father 49       Pancreatic  . Diabetes Father   . Depression Brother   . Depression Daughter   . Epilepsy Brother     Social History   Socioeconomic History  . Marital status: Married    Spouse name: Abigail Butts  . Number of children: 4  . Years of education: Not on file  . Highest education level: Some college, no degree  Occupational History  . Occupation: techinal specialist     Employer: [employer]  Tobacco Use  . Smoking status: Former Smoker    Packs/day: 1.00     Years: 2.00    Pack years: 2.00    Quit date: 01/30/1988    Years since quitting: 31.9  . Smokeless tobacco: Never Used  Vaping Use  . Vaping Use: Never used  Substance and Sexual Activity  . Alcohol use: Yes    Alcohol/week: 5.0 standard drinks    Types: 5 Cans of beer per week  . Drug use: No  . Sexual activity: Yes    Partners: Female    Birth control/protection: None  Other Topics Concern  . Not on file  Social History Narrative   Married, they have 4 children.    Social Determinants of Health   Financial Resource Strain: Low Risk   . Difficulty of Paying Living Expenses: Not hard at all  Food Insecurity: No Food Insecurity  . Worried About Charity fundraiser in the Last Year: Never true  . Ran Out of Food in the Last Year: Never true  Transportation Needs: No Transportation Needs  . Lack of Transportation (Medical): No  . Lack of Transportation (Non-Medical): No  Physical Activity: Insufficiently Active  . Days of Exercise per Week: 2 days  . Minutes of Exercise per Session: 30 min  Stress: No Stress Concern Present  . Feeling of Stress :  Not at all  Social Connections: Moderately Integrated  . Frequency of Communication with Friends and Family: More than three times a week  . Frequency of Social Gatherings with Friends and Family: Three times a week  . Attends Religious Services: 1 to 4 times per year  . Active Member of Clubs or Organizations: No  . Attends Archivist Meetings: Not on file  . Marital Status: Married  Human resources officer Violence: Not At Risk  . Fear of Current or Ex-Partner: No  . Emotionally Abused: No  . Physically Abused: No  . Sexually Abused: No    Review of Systems: See HPI, otherwise negative ROS  Physical Exam: BP (!) 141/98   Pulse 72   Temp 97.7 F (36.5 C) (Temporal)   Resp 16   Ht 5\' 6"  (1.676 m)   Wt 88.5 kg   SpO2 98%   BMI 31.47 kg/m  General:   Alert,  pleasant and cooperative in NAD Head:  Normocephalic  and atraumatic. Neck:  Supple; no masses or thyromegaly. Lungs:  Clear throughout to auscultation.    Heart:  Regular rate and rhythm. Abdomen:  Soft, nontender and nondistended. Normal bowel sounds, without guarding, and without rebound.   Neurologic:  Alert and  oriented x4;  grossly normal neurologically.  Impression/Plan: Kevin Bernard is here for an colonoscopy to be performed for colon cancer screening  Risks, benefits, limitations, and alternatives regarding  colonoscopy have been reviewed with the patient.  Questions have been answered.  All parties agreeable.   Sherri Sear, MD  01/18/2020, 8:00 AM

## 2020-01-18 NOTE — Transfer of Care (Signed)
Immediate Anesthesia Transfer of Care Note  Patient: Kevin Bernard  Procedure(s) Performed: COLONOSCOPY WITH PROPOFOL (N/A )  Patient Location: PACU and Endoscopy Unit  Anesthesia Type:General  Level of Consciousness: drowsy and patient cooperative  Airway & Oxygen Therapy: Patient Spontanous Breathing and Patient connected to nasal cannula oxygen  Post-op Assessment: Report given to RN and Post -op Vital signs reviewed and stable  Post vital signs: Reviewed and stable  Last Vitals:  Vitals Value Taken Time  BP 108/95 01/18/20 0835  Temp    Pulse 70 01/18/20 0836  Resp 18 01/18/20 0836  SpO2 96 % 01/18/20 0836  Vitals shown include unvalidated device data.  Last Pain:  Vitals:   01/18/20 0734  TempSrc: Temporal  PainSc: 0-No pain         Complications: No complications documented.

## 2020-01-18 NOTE — Op Note (Signed)
[facility] Gastroenterology Patient Name: Kevin Bernard Procedure Date: 01/18/2020 8:06 AM MRN: [id number] Account #: [id number] Date of Birth: 1965/02/20 Admit Type: Outpatient Age: 54 Room: [facility] ENDO ROOM 1 Gender: Male Note Status: Finalized Procedure:             Colonoscopy Indications:           Screening for colorectal malignant neoplasm, Last                         colonoscopy: May 2011 Providers:             Lin Landsman MD, MD Referring MD:          Bethena Roys. Sowles, MD (Referring MD) Medicines:             General Anesthesia Complications:         No immediate complications. Estimated blood loss: None. Procedure:             Pre-Anesthesia Assessment:                        - Prior to the procedure, a History and Physical was                         performed, and patient medications and allergies were                         reviewed. The patient is competent. The risks and                         benefits of the procedure and the sedation options and                         risks were discussed with the patient. All questions                         were answered and informed consent was obtained.                         Patient identification and proposed procedure were                         verified by the physician, the nurse, the                         anesthesiologist, the anesthetist and the technician                         in the pre-procedure area in the procedure room in the                         endoscopy suite. Mental Status Examination: alert and                         oriented. Airway Examination: normal oropharyngeal                         airway and neck mobility. Respiratory Examination:  clear to auscultation. CV Examination: normal.                         Prophylactic Antibiotics: The patient does not require                         prophylactic antibiotics. Prior Anticoagulants: The                          patient has taken no previous anticoagulant or                         antiplatelet agents except for aspirin. ASA Grade                         Assessment: II - A patient with mild systemic disease.                         After reviewing the risks and benefits, the patient                         was deemed in satisfactory condition to undergo the                         procedure. The anesthesia plan was to use general                         anesthesia. Immediately prior to administration of                         medications, the patient was re-assessed for adequacy                         to receive sedatives. The heart rate, respiratory                         rate, oxygen saturations, blood pressure, adequacy of                         pulmonary ventilation, and response to care were                         monitored throughout the procedure. The physical                         status of the patient was re-assessed after the                         procedure.                        After obtaining informed consent, the colonoscope was                         passed under direct vision. Throughout the procedure,                         the patient's blood pressure, pulse, and oxygen  saturations were monitored continuously. The                         Colonoscope was introduced through the anus and                         advanced to the the cecum, identified by appendiceal                         orifice and ileocecal valve. The colonoscopy was                         performed without difficulty. The patient tolerated                         the procedure well. The quality of the bowel                         preparation was evaluated using the BBPS [facility] Bowel                         Preparation Scale) with scores of: Right Colon = 3,                         Transverse Colon = 3 and Left Colon = 3 (entire mucosa                         seen  well with no residual staining, small fragments                         of stool or opaque liquid). The total BBPS score                         equals 9. Findings:      Skin tags were found on perianal exam.      Three sessile polyps were found in the transverse colon, ascending colon       and appendiceal orifice. The polyps were 3 to 4 mm in size. These polyps       were removed with a cold snare. Resection and retrieval were complete.      Non-bleeding external hemorrhoids were found during retroflexion. The       hemorrhoids were medium-sized.      The exam was otherwise without abnormality. Impression:            - Perianal skin tags found on perianal exam.                        - Three 3 to 4 mm polyps in the transverse colon, in                         the ascending colon and at the appendiceal orifice,                         removed with a cold snare. Resected and retrieved.                        - Non-bleeding external hemorrhoids.                        -  The examination was otherwise normal. Recommendation:        - Discharge patient to home (with escort).                        - Resume previous diet today.                        - Continue present medications.                        - Await pathology results.                        - Repeat colonoscopy in 7 years for surveillance.                        - Return to my office at appointment to be scheduled                         to discuss about hemorrhoid ligation. Procedure Code(s):     --- Professional ---                        [phone], Colonoscopy, flexible; with removal of                         tumor(s), polyp(s), or other lesion(s) by snare                         technique Diagnosis Code(s):     --- Professional ---                        K64.4, Residual hemorrhoidal skin tags                        Z12.11, Encounter for screening for malignant neoplasm                         of colon                         K63.5, Polyp of colon CPT copyright 2019 American Medical Association. All rights reserved. The codes documented in this report are preliminary and upon coder review may  be revised to meet current compliance requirements. Dr. Ulyess Mort Lin Landsman MD, MD 01/18/2020 8:33:42 AM This report has been signed electronically. Number of Addenda: 0 Note Initiated On: 01/18/2020 8:06 AM Scope Withdrawal Time: 0 hours 16 minutes 21 seconds  Total Procedure Duration: 0 hours 18 minutes 32 seconds  Estimated Blood Loss:  Estimated blood loss: none.      [facility]

## 2020-01-19 ENCOUNTER — Encounter: Payer: Self-pay | Admitting: Gastroenterology

## 2020-01-19 LAB — SURGICAL PATHOLOGY

## 2020-01-19 NOTE — Anesthesia Postprocedure Evaluation (Signed)
Anesthesia Post Note  Patient: Kevin Bernard  Procedure(s) Performed: COLONOSCOPY WITH PROPOFOL (N/A )  Patient location during evaluation: PACU Anesthesia Type: General Level of consciousness: awake and alert Pain management: pain level controlled Vital Signs Assessment: post-procedure vital signs reviewed and stable Respiratory status: spontaneous breathing, nonlabored ventilation and respiratory function stable Cardiovascular status: blood pressure returned to baseline and stable Postop Assessment: no apparent nausea or vomiting Anesthetic complications: no   No complications documented.   Last Vitals:  Vitals:   01/18/20 0850 01/18/20 0900  BP: 124/86 (!) 147/96  Pulse: 70 63  Resp: 14 15  Temp:    SpO2: 98% 100%    Last Pain:  Vitals:   01/19/20 0804  TempSrc:   PainSc: 0-No pain                 Brett Canales Khadeejah Castner

## 2020-01-26 ENCOUNTER — Ambulatory Visit (INDEPENDENT_AMBULATORY_CARE_PROVIDER_SITE_OTHER): Payer: BLUE CROSS/BLUE SHIELD | Admitting: Emergency Medicine

## 2020-01-26 ENCOUNTER — Other Ambulatory Visit: Payer: Self-pay

## 2020-01-26 DIAGNOSIS — Z23 Encounter for immunization: Secondary | ICD-10-CM | POA: Diagnosis not present

## 2020-07-29 DIAGNOSIS — D2261 Melanocytic nevi of right upper limb, including shoulder: Secondary | ICD-10-CM | POA: Diagnosis not present

## 2020-07-29 DIAGNOSIS — D225 Melanocytic nevi of trunk: Secondary | ICD-10-CM | POA: Diagnosis not present

## 2020-07-29 DIAGNOSIS — D2271 Melanocytic nevi of right lower limb, including hip: Secondary | ICD-10-CM | POA: Diagnosis not present

## 2020-07-29 DIAGNOSIS — D2262 Melanocytic nevi of left upper limb, including shoulder: Secondary | ICD-10-CM | POA: Diagnosis not present

## 2020-08-25 ENCOUNTER — Other Ambulatory Visit: Payer: Self-pay | Admitting: Family Medicine

## 2020-08-25 DIAGNOSIS — F410 Panic disorder [episodic paroxysmal anxiety] without agoraphobia: Secondary | ICD-10-CM

## 2020-08-25 DIAGNOSIS — F411 Generalized anxiety disorder: Secondary | ICD-10-CM

## 2020-08-25 NOTE — Telephone Encounter (Signed)
Requested medications are due for refill today.  yes  Requested medications are on the active medications list.  yes  Last refill. 09/04/2019  Future visit scheduled.   yes  Notes to clinic.  Courtesy refill already given.

## 2020-08-25 NOTE — Telephone Encounter (Signed)
Next appt is 09/02/20

## 2020-09-01 NOTE — Progress Notes (Deleted)
Name: Kevin Bernard   MRN: [id number]    DOB: [birth year]   Date:09/01/2020       Progress Note  Subjective  Chief Complaint  Follow Up  HPI  HTN: bp at home has been 120/80's we switched him from Lisinopril 10 mg to Losartan 50 mg back in March 2020 because lisinopril was causing a dry cough,. He is now on lower dose because he was feeling dizzy on 50 mg.    Dyslipidemia: he is eating tree nuts but not enough fish, he works a lot around his house and yard, he will return for CPE and repeat labs soon    GAD: he tried Zoloft but did not control symptoms, responded to Prozac but it caused tremors, since 07/2016 he has been on Citalopram and is doing well. No side effects, helping with anxiety, score is zero. He is doing much better now    GERD: he is now on Famotidine  daily, and symptoms under control.  He likes to eat spicy food and has flares intermittently but not very often with medication . He needs refills of medication   History of COVID: answered questions about vaccine he seemed pleased   Patient Active Problem List   Diagnosis Date Noted   Encounter for screening colonoscopy    Pre-diabetes 04/16/2018   Panic attack 05/20/2016   Abnormal EKG 05/18/2016   Anxiety about health 05/08/2016   Internal hemorrhoids 12/29/2015   Back pain, chronic 01/18/2015   Gastro-esophageal reflux disease without esophagitis 01/18/2015   Numerous moles 01/18/2015   Hyperglycemia 01/18/2015   History of fracture of femur 01/18/2015    Past Surgical History:  Procedure Laterality Date   APPENDECTOMY     COLONOSCOPY WITH PROPOFOL N/A 01/18/2020   Procedure: COLONOSCOPY WITH PROPOFOL;  Surgeon: Lin Landsman, MD;  Location: [place];  Service: Gastroenterology;  Laterality: N/A;   right femur internal fixiation     VASECTOMY      Family History  Problem Relation Age of Onset   CVA Mother    Cancer Father 71       Pancreatic   Diabetes Father    Depression Brother     Depression Daughter    Epilepsy Brother     Social History   Tobacco Use   Smoking status: Former    Packs/day: 1.00    Years: 2.00    Pack years: 2.00    Types: Cigarettes    Quit date: 01/30/1988    Years since quitting: 32.6   Smokeless tobacco: Never  Substance Use Topics   Alcohol use: Yes    Alcohol/week: 5.0 standard drinks    Types: 5 Cans of beer per week     Current Outpatient Medications:    citalopram (CELEXA) 20 MG tablet, TAKE 1 TABLET BY MOUTH EVERY DAY, Disp: 90 tablet, Rfl: 0   famotidine (PEPCID) 40 MG tablet, Take 1 tablet (40 mg total) by mouth daily., Disp: 180 tablet, Rfl: 1   losartan (COZAAR) 25 MG tablet, Take 1 tablet (25 mg total) by mouth daily., Disp: 90 tablet, Rfl: 3   Multiple Vitamin (MULTIVITAMINS PO), Take 1 tablet by mouth daily., Disp: , Rfl:   Allergies  Allergen Reactions   Ace Inhibitors Cough    I personally reviewed active problem list, medication list, allergies, family history, social history, health maintenance with the patient/caregiver today.   ROS  ***  Objective  There were no vitals filed for this visit.  There is no  height or weight on file to calculate BMI.  Physical Exam ***  No results found for this or any previous visit (from the past 2160 hour(s)).  Diabetic Foot Exam: Diabetic Foot Exam - Simple   No data filed    ***  PHQ2/9: Depression screen [facility] 2/9 11/24/2019 12/16/2018 10/15/2018 04/14/2018 10/11/2017  Decreased Interest 0 0 0 0 0  Down, Depressed, Hopeless 0 0 0 0 0  PHQ - 2 Score 0 0 0 0 0  Altered sleeping 0 0 1 0 2  Tired, decreased energy 0 0 0 0 2  Change in appetite 0 0 0 0 0  Feeling bad or failure about yourself  0 0 0 0 0  Trouble concentrating 0 0 0 0 0  Moving slowly or fidgety/restless 0 0 0 0 0  Suicidal thoughts 0 0 0 0 0  PHQ-9 Score 0 0 1 0 4  Difficult doing work/chores - Not difficult at all Not difficult at all Not difficult at all Not difficult at all    phq 9 is  {gen pos [id number] ***  Fall Risk: Fall Risk  11/24/2019 11/24/2019 09/04/2019 12/16/2018 10/15/2018  Falls in the past year? 0 0 0 0 0  Number falls in past yr: 0 0 0 0 0  Injury with Fall? 1 0 0 0 0   ***   Functional Status Survey:   ***   Assessment & Plan  *** There are no diagnoses linked to this encounter.

## 2020-09-02 ENCOUNTER — Ambulatory Visit: Payer: BLUE CROSS/BLUE SHIELD | Admitting: Family Medicine

## 2020-09-06 NOTE — Progress Notes (Signed)
Name: Kevin Bernard   MRN: [id number]    DOB: 1965-08-01   Date:09/07/2020       Progress Note  Subjective  Chief Complaint  Follow Up  HPI  HTN: Losartan 50 mg , doing well, no chest pain, palpitation or SOB    Dyslipidemia: improved and risk is below 10 %   The 10-year ASCVD risk score [name], et al., 2013) is: 7.5%   Values used to calculate the score:     Age: 55 years     Sex: Male     Is Non-Hispanic African American: No     Diabetic: No     Tobacco smoker: No     Systolic Blood Pressure: 0000000 mmHg     Is BP treated: Yes     HDL Cholesterol: 41 mg/dL     Total Cholesterol: 209 mg/dL    GAD: he tried Zoloft but did not control symptoms, responded to Prozac but it caused tremors, since 07/2016 he has been on Citalopram and he has been toleraing it well, no side effects.    GERD: he is now on Famotidine  daily, and symptoms under control.  He likes to eat spicy food and has flares intermittently but not very often with medication . Unchanged   Pre-diabetes: A1C has been elevated, last year it was down from 5.8 % to 5.7 % , he is active at his yard, also eating smaller portion  Patient Active Problem List   Diagnosis Date Noted   Encounter for screening colonoscopy    Pre-diabetes 04/16/2018   Panic attack 05/20/2016   Abnormal EKG 05/18/2016   Anxiety about health 05/08/2016   Internal hemorrhoids 12/29/2015   Back pain, chronic 01/18/2015   Gastro-esophageal reflux disease without esophagitis 01/18/2015   Numerous moles 01/18/2015   Hyperglycemia 01/18/2015   History of fracture of femur 01/18/2015    Past Surgical History:  Procedure Laterality Date   APPENDECTOMY     COLONOSCOPY WITH PROPOFOL N/A 01/18/2020   Procedure: COLONOSCOPY WITH PROPOFOL;  Surgeon: Lin Landsman, MD;  Location: [place];  Service: Gastroenterology;  Laterality: N/A;   right femur internal fixiation     VASECTOMY      Family History  Problem Relation Age of Onset    CVA Mother    Cancer Father 79       Pancreatic   Diabetes Father    Depression Brother    Depression Daughter    Epilepsy Brother     Social History   Tobacco Use   Smoking status: Former    Packs/day: 1.00    Years: 2.00    Pack years: 2.00    Types: Cigarettes    Quit date: 01/30/1988    Years since quitting: 32.6   Smokeless tobacco: Never  Substance Use Topics   Alcohol use: Yes    Alcohol/week: 5.0 standard drinks    Types: 5 Cans of beer per week     Current Outpatient Medications:    citalopram (CELEXA) 20 MG tablet, TAKE 1 TABLET BY MOUTH EVERY DAY, Disp: 90 tablet, Rfl: 0   famotidine (PEPCID) 40 MG tablet, Take 1 tablet (40 mg total) by mouth daily., Disp: 180 tablet, Rfl: 1   losartan (COZAAR) 25 MG tablet, Take 1 tablet (25 mg total) by mouth daily., Disp: 90 tablet, Rfl: 3   Multiple Vitamin (MULTIVITAMINS PO), Take 1 tablet by mouth daily., Disp: , Rfl:   Allergies  Allergen Reactions   Ace  Inhibitors Cough    I personally reviewed active problem list, medication list, allergies, family history, social history, health maintenance with the patient/caregiver today.   ROS  Constitutional: Negative for fever or weight change.  Respiratory: Negative for cough and shortness of breath.   Cardiovascular: Negative for chest pain or palpitations.  Gastrointestinal: Negative for abdominal pain, no bowel changes.  Musculoskeletal: Negative for gait problem or joint swelling.  Skin: Negative for rash.  Neurological: Negative for dizziness or headache.  No other specific complaints in a complete review of systems (except as listed in HPI above).   Objective  Vitals:   09/07/20 1427  BP: 128/70  Pulse: 68  Resp: 16  Temp: 98.1 F (36.7 C)  SpO2: 97%  Weight: 197 lb (89.4 kg)  Height: '5\' 6"'$  (1.676 m)    Body mass index is 31.8 kg/m.  Physical Exam  Constitutional: Patient appears well-developed and well-nourished. Obese  No distress.  HEENT:  head atraumatic, normocephalic, pupils equal and reactive to light, neck supple Cardiovascular: Normal rate, regular rhythm and normal heart sounds.  No murmur heard. No BLE edema. Pulmonary/Chest: Effort normal and breath sounds normal. No respiratory distress. Abdominal: Soft.  There is no tenderness. Psychiatric: Patient has a normal mood and affect. behavior is normal. Judgment and thought content normal.   PHQ2/9: Depression screen [facility] 2/9 09/07/2020 11/24/2019 12/16/2018 10/15/2018 04/14/2018  Decreased Interest 0 0 0 0 0  Down, Depressed, Hopeless 0 0 0 0 0  PHQ - 2 Score 0 0 0 0 0  Altered sleeping - 0 0 1 0  Tired, decreased energy - 0 0 0 0  Change in appetite - 0 0 0 0  Feeling bad or failure about yourself  - 0 0 0 0  Trouble concentrating - 0 0 0 0  Moving slowly or fidgety/restless - 0 0 0 0  Suicidal thoughts - 0 0 0 0  PHQ-9 Score - 0 0 1 0  Difficult doing work/chores - - Not difficult at all Not difficult at all Not difficult at all    phq 9 is negative   Fall Risk: Fall Risk  09/07/2020 11/24/2019 11/24/2019 09/04/2019 12/16/2018  Falls in the past year? 0 0 0 0 0  Number falls in past yr: 0 0 0 0 0  Injury with Fall? 0 1 0 0 0     Functional Status Survey: Is the patient deaf or have difficulty hearing?: No Does the patient have difficulty seeing, even when wearing glasses/contacts?: No Does the patient have difficulty concentrating, remembering, or making decisions?: No Does the patient have difficulty walking or climbing stairs?: No Does the patient have difficulty dressing or bathing?: No Does the patient have difficulty doing errands alone such as visiting a doctor's office or shopping?: No   Assessment & Plan  1. Dyslipidemia   2. Pre-diabetes   3. GAD (generalized anxiety disorder)  - citalopram (CELEXA) 20 MG tablet; Take 1 tablet (20 mg total) by mouth daily.  Dispense: 90 tablet; Refill: 3  4. Gastro-esophageal reflux disease without  esophagitis  - famotidine (PEPCID) 40 MG tablet; Take 1 tablet (40 mg total) by mouth daily.  Dispense: 180 tablet; Refill: 1  5. Hypertension, benign  - losartan (COZAAR) 25 MG tablet; Take 1 tablet (25 mg total) by mouth daily.  Dispense: 90 tablet; Refill: 3  6. Panic attack  - citalopram (CELEXA) 20 MG tablet; Take 1 tablet (20 mg total) by mouth daily.  Dispense: 90 tablet;  Refill: 3

## 2020-09-07 ENCOUNTER — Other Ambulatory Visit: Payer: Self-pay

## 2020-09-07 ENCOUNTER — Encounter: Payer: Self-pay | Admitting: Family Medicine

## 2020-09-07 ENCOUNTER — Ambulatory Visit: Payer: BLUE CROSS/BLUE SHIELD | Admitting: Family Medicine

## 2020-09-07 VITALS — BP 128/70 | HR 68 | Temp 98.1°F | Resp 16 | Ht 66.0 in | Wt 197.0 lb

## 2020-09-07 DIAGNOSIS — K219 Gastro-esophageal reflux disease without esophagitis: Secondary | ICD-10-CM | POA: Diagnosis not present

## 2020-09-07 DIAGNOSIS — E785 Hyperlipidemia, unspecified: Secondary | ICD-10-CM | POA: Diagnosis not present

## 2020-09-07 DIAGNOSIS — R7303 Prediabetes: Secondary | ICD-10-CM | POA: Diagnosis not present

## 2020-09-07 DIAGNOSIS — I10 Essential (primary) hypertension: Secondary | ICD-10-CM

## 2020-09-07 DIAGNOSIS — F410 Panic disorder [episodic paroxysmal anxiety] without agoraphobia: Secondary | ICD-10-CM

## 2020-09-07 DIAGNOSIS — F411 Generalized anxiety disorder: Secondary | ICD-10-CM

## 2020-09-07 MED ORDER — LOSARTAN POTASSIUM 25 MG PO TABS: 25.0000 mg | ORAL_TABLET | Freq: Every day | ORAL | 3 refills | Status: DC

## 2020-09-07 MED ORDER — FAMOTIDINE 40 MG PO TABS: 40.0000 mg | ORAL_TABLET | Freq: Every day | ORAL | 1 refills | Status: DC

## 2020-09-07 MED ORDER — CITALOPRAM HYDROBROMIDE 20 MG PO TABS: 20.0000 mg | ORAL_TABLET | Freq: Every day | ORAL | 3 refills | Status: DC

## 2020-12-05 NOTE — Progress Notes (Signed)
Name: Kevin Bernard   MRN: [id number]    DOB: 17-Dec-1965   Date:12/06/2020       Progress Note  Subjective  Chief Complaint  Annual Exam  HPI  Patient presents for annual CPE .  IPSS Questionnaire (AUA-7): Over the past month.   1)  How often have you had a sensation of not emptying your bladder completely after you finish urinating?  0 - Not at all  2)  How often have you had to urinate again less than two hours after you finished urinating? 0 - Not at all  3)  How often have you found you stopped and started again several times when you urinated?  0 - Not at all  4) How difficult have you found it to postpone urination?  0 - Not at all  5) How often have you had a weak urinary stream?  0 - Not at all  6) How often have you had to push or strain to begin urination?  0 - Not at all  7) How many times did you most typically get up to urinate from the time you went to bed until the time you got up in the morning?  1 - 1 time  Total score:  0-7 mildly symptomatic   8-19 moderately symptomatic   20-35 severely symptomatic     Diet: discussed healthier diet  Exercise: needs to increase regular activity   Depression: phq 9 is negative Depression screen [facility] 2/9 12/06/2020 09/07/2020 11/24/2019 12/16/2018 10/15/2018  Decreased Interest 0 0 0 0 0  Down, Depressed, Hopeless 0 0 0 0 0  PHQ - 2 Score 0 0 0 0 0  Altered sleeping 0 - 0 0 1  Tired, decreased energy 0 - 0 0 0  Change in appetite 0 - 0 0 0  Feeling bad or failure about yourself  0 - 0 0 0  Trouble concentrating 0 - 0 0 0  Moving slowly or fidgety/restless 0 - 0 0 0  Suicidal thoughts 0 - 0 0 0  PHQ-9 Score 0 - 0 0 1  Difficult doing work/chores - - - Not difficult at all Not difficult at all  Some recent data might be hidden    Hypertension:  BP Readings from Last 3 Encounters:  12/06/20 130/82  09/07/20 128/70  01/18/20 (!) 147/96    Obesity: Wt Readings from Last 3 Encounters:  12/06/20 198 lb (89.8 kg)  09/07/20  197 lb (89.4 kg)  01/18/20 195 lb (88.5 kg)   BMI Readings from Last 3 Encounters:  12/06/20 31.96 kg/m  09/07/20 31.80 kg/m  01/18/20 31.47 kg/m     Lipids:  Lab Results  Component Value Date   CHOL 209 (H) 11/24/2019   CHOL 210 (H) 04/14/2018   CHOL 201 (A) 09/11/2017   Lab Results  Component Value Date   HDL 41 11/24/2019   HDL 39 (L) 04/14/2018   HDL 30 (A) 09/11/2017   Lab Results  Component Value Date   LDLCALC 125 (H) 11/24/2019   LDLCALC 142 (H) 04/14/2018   LDLCALC 148 09/11/2017   Lab Results  Component Value Date   TRIG 298 (H) 11/24/2019   TRIG 155 (H) 04/14/2018   TRIG 114 09/11/2017   Lab Results  Component Value Date   CHOLHDL 5.1 (H) 11/24/2019   CHOLHDL 5.4 (H) 04/14/2018   CHOLHDL 4.8 04/09/2017   No results found for: LDLDIRECT Glucose:  Glucose  Date Value Ref Range Status  11/07/2017  84 65 - 99 mg/dL Final   Glucose, Bld  Date Value Ref Range Status  11/24/2019 79 65 - 99 mg/dL Final    Comment:    .            Fasting reference interval .   04/14/2018 85 65 - 99 mg/dL Final    Comment:    .            Fasting reference interval .   04/09/2017 96 65 - 139 mg/dL Final    Comment:    .        Non-fasting reference interval .     [place] Office Visit from 12/06/2020 in [facility]  AUDIT-C Score 2      Married STD testing and prevention (HIV/chl/gon/syphilis): N/A Hep C: 04/14/18  Skin cancer: Discussed monitoring for atypical lesions Colorectal cancer: 01/18/20 Prostate cancer:  Lab Results  Component Value Date   PSA 1.60 11/24/2019   PSA 3.1 04/14/2018   PSA 1.3 04/09/2017     Lung cancer: Low Dose CT Chest recommended if Age 55-80 years, 30 pack-year currently smoking OR have quit w/in 15years. Patient does not qualify.  ECG:  05/14/16  Vaccines:   Shingrix: up to date  Pneumonia: educated and discussed with patient. Flu: refused  COVID-19: needs booster   Advanced Care  Planning: A voluntary discussion about advance care planning including the explanation and discussion of advance directives.  Discussed health care proxy and Living will, and the patient was able to identify a health care proxy as  wife   Patient Active Problem List   Diagnosis Date Noted   Pre-diabetes 04/16/2018   Panic attack 05/20/2016   Abnormal EKG 05/18/2016   Anxiety about health 05/08/2016   Internal hemorrhoids 12/29/2015   Back pain, chronic 01/18/2015   Gastro-esophageal reflux disease without esophagitis 01/18/2015   Numerous moles 01/18/2015   Hyperglycemia 01/18/2015   History of fracture of femur 01/18/2015    Past Surgical History:  Procedure Laterality Date   APPENDECTOMY     COLONOSCOPY WITH PROPOFOL N/A 01/18/2020   Procedure: COLONOSCOPY WITH PROPOFOL;  Surgeon: Lin Landsman, MD;  Location: [facility];  Service: Gastroenterology;  Laterality: N/A;   right femur internal fixiation     VASECTOMY      Family History  Problem Relation Age of Onset   CVA Mother    Cancer Father 70       Pancreatic   Diabetes Father    Depression Brother    Depression Daughter    Epilepsy Brother     Social History   Socioeconomic History   Marital status: Married    Spouse name: Abigail Butts   Number of children: 4   Years of education: Not on file   Highest education level: Some college, no degree  Occupational History   Occupation: Therapist, music: [employer]  Tobacco Use   Smoking status: Former    Packs/day: 1.00    Years: 2.00    Pack years: 2.00    Types: Cigarettes    Quit date: 01/30/1988    Years since quitting: 32.8   Smokeless tobacco: Never  Vaping Use   Vaping Use: Never used  Substance and Sexual Activity   Alcohol use: Yes    Alcohol/week: 5.0 standard drinks    Types: 5 Cans of beer per week   Drug use: No   Sexual activity: Yes    Partners: Female  Birth control/protection: None  Other Topics Concern   Not on  file  Social History Narrative   Married, they have 4 children.    Social Determinants of Health   Financial Resource Strain: Low Risk    Difficulty of Paying Living Expenses: Not very hard  Food Insecurity: No Food Insecurity   Worried About Charity fundraiser in the Last Year: Never true   Ran Out of Food in the Last Year: Never true  Transportation Needs: No Transportation Needs   Lack of Transportation (Medical): No   Lack of Transportation (Non-Medical): No  Physical Activity: Insufficiently Active   Days of Exercise per Week: 2 days   Minutes of Exercise per Session: 20 min  Stress: No Stress Concern Present   Feeling of Stress : Only a little  Social Connections: Moderately Integrated   Frequency of Communication with Friends and Family: More than three times a week   Frequency of Social Gatherings with Friends and Family: Once a week   Attends Religious Services: More than 4 times per year   Active Member of [employer] or Organizations: No   Attends Archivist Meetings: Never   Marital Status: Married  Human resources officer Violence: Not At Risk   Fear of Current or Ex-Partner: No   Emotionally Abused: No   Physically Abused: No   Sexually Abused: No     Current Outpatient Medications:    citalopram (CELEXA) 20 MG tablet, Take 1 tablet (20 mg total) by mouth daily., Disp: 90 tablet, Rfl: 3   famotidine (PEPCID) 40 MG tablet, Take 1 tablet (40 mg total) by mouth daily., Disp: 180 tablet, Rfl: 1   losartan (COZAAR) 25 MG tablet, Take 1 tablet (25 mg total) by mouth daily., Disp: 90 tablet, Rfl: 3   Multiple Vitamin (MULTIVITAMINS PO), Take 1 tablet by mouth daily., Disp: , Rfl:   Allergies  Allergen Reactions   Ace Inhibitors Cough     ROS  Constitutional: Negative for fever or weight change.  Respiratory: Negative for cough and shortness of breath.   Cardiovascular: Negative for chest pain or palpitations.  Gastrointestinal: Negative for abdominal pain, no  bowel changes.  Musculoskeletal: Negative for gait problem or joint swelling.  Skin: Negative for rash.  Neurological: Negative for dizziness or headache.  No other specific complaints in a complete review of systems (except as listed in HPI above).    Objective  Vitals:   12/06/20 1401  BP: 130/82  Pulse: 75  Resp: 16  Temp: 97.9 F (36.6 C)  SpO2: 97%  Weight: 198 lb (89.8 kg)  Height: 5\' 6"  (1.676 m)    Body mass index is 31.96 kg/m.  Physical Exam  Constitutional: Patient appears well-developed and well-nourished. No distress.  HENT: Head: Normocephalic and atraumatic. Ears: B TMs ok, no erythema or effusion; Nose: Not done  Mouth/Throat: not done  Eyes: Conjunctivae and EOM are normal. Pupils are equal, round, and reactive to light. No scleral icterus.  Neck: Normal range of motion. Neck supple. No JVD present. No thyromegaly present.  Cardiovascular: Normal rate, regular rhythm and normal heart sounds.  No murmur heard. No BLE edema. Pulmonary/Chest: Effort normal and breath sounds normal. No respiratory distress. Abdominal: Soft. Bowel sounds are normal, no distension. There is no tenderness. no masses MALE GENITALIA: Normal descended testes bilaterally, no masses palpated, no hernias, no lesions, no discharge RECTAL: N/A Musculoskeletal: Normal range of motion, no joint effusions. No gross deformities Neurological: he is alert and oriented  to person, place, and time. No cranial nerve deficit. Coordination, balance, strength, speech and gait are normal.  Skin: Skin is warm and dry. No rash noted. No erythema.  Psychiatric: Patient has a normal mood and affect. behavior is normal. Judgment and thought content normal.    Fall Risk: Fall Risk  12/06/2020 09/07/2020 11/24/2019 11/24/2019 09/04/2019  Falls in the past year? 0 0 0 0 0  Number falls in past yr: 0 0 0 0 0  Injury with Fall? 0 0 1 0 0  Risk for fall due to : No Fall Risks - - - -  Follow up Falls prevention  discussed - - - -      Functional Status Survey: Is the patient deaf or have difficulty hearing?: No Does the patient have difficulty seeing, even when wearing glasses/contacts?: No Does the patient have difficulty concentrating, remembering, or making decisions?: No Does the patient have difficulty walking or climbing stairs?: No Does the patient have difficulty dressing or bathing?: No Does the patient have difficulty doing errands alone such as visiting a doctor's office or shopping?: No    Assessment & Plan  1. Well adult exam  - Lipid panel - CBC with Differential/Platelet - COMPLETE METABOLIC PANEL WITH GFR - Hemoglobin A1c  2. Dyslipidemia  - Lipid panel  3. Pre-diabetes  - Hemoglobin A1c  4. Hypertension, benign  - COMPLETE METABOLIC PANEL WITH GFR  5. Long-term use of high-risk medication  - COMPLETE METABOLIC PANEL WITH GFR    -Prostate cancer screening and PSA options (with potential risks and benefits of testing vs not testing) were discussed along with recent recs/guidelines. -USPSTF grade A and B recommendations reviewed with patient; age-appropriate recommendations, preventive care, screening tests, etc discussed and encouraged; healthy living encouraged; see AVS for patient education given to patient -Discussed importance of 150 minutes of physical activity weekly, eat two servings of fish weekly, eat one serving of tree nuts ( cashews, pistachios, pecans, almonds.Marland Kitchen) every other day, eat 6 servings of fruit/vegetables daily and drink plenty of water and avoid sweet beverages.

## 2020-12-06 ENCOUNTER — Ambulatory Visit (INDEPENDENT_AMBULATORY_CARE_PROVIDER_SITE_OTHER): Payer: BLUE CROSS/BLUE SHIELD | Admitting: Family Medicine

## 2020-12-06 ENCOUNTER — Encounter: Payer: Self-pay | Admitting: Family Medicine

## 2020-12-06 ENCOUNTER — Other Ambulatory Visit: Payer: Self-pay

## 2020-12-06 VITALS — BP 130/82 | HR 75 | Temp 97.9°F | Resp 16 | Ht 66.0 in | Wt 198.0 lb

## 2020-12-06 DIAGNOSIS — I10 Essential (primary) hypertension: Secondary | ICD-10-CM

## 2020-12-06 DIAGNOSIS — Z0000 Encounter for general adult medical examination without abnormal findings: Secondary | ICD-10-CM | POA: Diagnosis not present

## 2020-12-06 DIAGNOSIS — R7303 Prediabetes: Secondary | ICD-10-CM | POA: Diagnosis not present

## 2020-12-06 DIAGNOSIS — E785 Hyperlipidemia, unspecified: Secondary | ICD-10-CM | POA: Diagnosis not present

## 2020-12-06 DIAGNOSIS — Z79899 Other long term (current) drug therapy: Secondary | ICD-10-CM

## 2020-12-26 DIAGNOSIS — R7303 Prediabetes: Secondary | ICD-10-CM | POA: Diagnosis not present

## 2020-12-26 DIAGNOSIS — E785 Hyperlipidemia, unspecified: Secondary | ICD-10-CM | POA: Diagnosis not present

## 2020-12-26 DIAGNOSIS — Z0000 Encounter for general adult medical examination without abnormal findings: Secondary | ICD-10-CM | POA: Diagnosis not present

## 2020-12-27 LAB — COMPREHENSIVE METABOLIC PANEL
ALT: 43 IU/L (ref 0–44)
AST: 27 IU/L (ref 0–40)
Albumin/Globulin Ratio: 2.2 (ref 1.2–2.2)
Albumin: 4.4 g/dL (ref 3.8–4.9)
Alkaline Phosphatase: 112 IU/L (ref 44–121)
BUN/Creatinine Ratio: 17 (ref 9–20)
BUN: 18 mg/dL (ref 6–24)
Bilirubin Total: 0.5 mg/dL (ref 0.0–1.2)
CO2: 21 mmol/L (ref 20–29)
Calcium: 9.2 mg/dL (ref 8.7–10.2)
Chloride: 101 mmol/L (ref 96–106)
Creatinine, Ser: 1.07 mg/dL (ref 0.76–1.27)
Globulin, Total: 2 g/dL (ref 1.5–4.5)
Glucose: 88 mg/dL (ref 70–99)
Potassium: 4.7 mmol/L (ref 3.5–5.2)
Sodium: 140 mmol/L (ref 134–144)
Total Protein: 6.4 g/dL (ref 6.0–8.5)
eGFR: 82 mL/min/{1.73_m2} (ref >59)

## 2020-12-27 LAB — CBC WITH DIFFERENTIAL/PLATELET
Basophils Absolute: 0.1 10*3/uL (ref 0.0–0.2)
Basos: 1 %
EOS (ABSOLUTE): 0.1 10*3/uL (ref 0.0–0.4)
Eos: 1 %
Hematocrit: 46.6 % (ref 37.5–51.0)
Hemoglobin: 15.8 g/dL (ref 13.0–17.7)
Immature Grans (Abs): 0 10*3/uL (ref 0.0–0.1)
Immature Granulocytes: 1 %
Lymphocytes Absolute: 2.1 10*3/uL (ref 0.7–3.1)
Lymphs: 33 %
MCH: 29.5 pg (ref 26.6–33.0)
MCHC: 33.9 g/dL (ref 31.5–35.7)
MCV: 87 fL (ref 79–97)
Monocytes Absolute: 0.6 10*3/uL (ref 0.1–0.9)
Monocytes: 9 %
Neutrophils Absolute: 3.6 10*3/uL (ref 1.4–7.0)
Neutrophils: 55 %
Platelets: 213 10*3/uL (ref 150–450)
RBC: 5.35 x10E6/uL (ref 4.14–5.80)
RDW: 12.2 % (ref 11.6–15.4)
WBC: 6.5 10*3/uL (ref 3.4–10.8)

## 2020-12-27 LAB — LIPID PANEL
Chol/HDL Ratio: 5.4 ratio — ABNORMAL HIGH (ref 0.0–5.0)
Cholesterol, Total: 220 mg/dL — ABNORMAL HIGH (ref 100–199)
HDL: 41 mg/dL (ref >39)
LDL Chol Calc (NIH): 122 mg/dL — ABNORMAL HIGH (ref 0–99)
Triglycerides: 322 mg/dL — ABNORMAL HIGH (ref 0–149)
VLDL Cholesterol Cal: 57 mg/dL — ABNORMAL HIGH (ref 5–40)

## 2020-12-27 LAB — HEMOGLOBIN A1C
Est. average glucose Bld gHb Est-mCnc: 123 mg/dL
Hgb A1c MFr Bld: 5.9 % — ABNORMAL HIGH (ref 4.8–5.6)

## 2021-01-18 ENCOUNTER — Ambulatory Visit (INDEPENDENT_AMBULATORY_CARE_PROVIDER_SITE_OTHER): Payer: BLUE CROSS/BLUE SHIELD | Admitting: Family Medicine

## 2021-01-18 ENCOUNTER — Encounter: Payer: Self-pay | Admitting: Family Medicine

## 2021-01-18 VITALS — BP 182/106 | HR 84 | Temp 97.6°F | Resp 18 | Ht 66.0 in | Wt 198.0 lb

## 2021-01-18 DIAGNOSIS — M722 Plantar fascial fibromatosis: Secondary | ICD-10-CM

## 2021-01-18 DIAGNOSIS — M25461 Effusion, right knee: Secondary | ICD-10-CM | POA: Diagnosis not present

## 2021-01-18 DIAGNOSIS — M25561 Pain in right knee: Secondary | ICD-10-CM

## 2021-01-18 DIAGNOSIS — I10 Essential (primary) hypertension: Secondary | ICD-10-CM | POA: Diagnosis not present

## 2021-01-18 MED ORDER — AMLODIPINE BESYLATE 2.5 MG PO TABS
2.5000 mg | ORAL_TABLET | Freq: Every day | ORAL | 0 refills | Status: DC
Start: 2021-01-18 — End: 2022-03-28

## 2021-01-18 MED ORDER — HYDROCODONE-ACETAMINOPHEN 5-300 MG PO TABS: 1.0000 | ORAL_TABLET | Freq: Four times a day (QID) | ORAL | 0 refills | Status: DC | PRN

## 2021-01-18 MED ORDER — DICLOFENAC SODIUM 75 MG PO TBEC: 75.0000 mg | DELAYED_RELEASE_TABLET | Freq: Two times a day (BID) | ORAL | 0 refills | Status: DC

## 2021-01-18 NOTE — Addendum Note (Signed)
Addended by: Steele Sizer F on: 01/18/2021 04:56 PM   Modules accepted: Level of Service

## 2021-01-18 NOTE — Progress Notes (Signed)
Name: Kevin Bernard   MRN: [id number]    DOB: [birth year]   Date:01/18/2021       Progress Note  Subjective  Chief Complaint  Chief Complaint  Patient presents with   Blood Pressure Check    Patient presented for BP check as he stated his machine at home was reading high and he wanted to verify accuracy. Patient sat 5 minutes prior to testing. Re-tested 10 minutes later. PCP Dr. Ancil Boozer notified of results and visit converted to PCP/office visit.   Hypertension   Leg Pain    Right lower leg/foot pain     HPI  HTN: wife bought a bp monitor last week and has noticed his bp has been persistently elevated, in the 170/90's. He denies chest pain , palpitation or SOB. He denies increase in stress but he has been in pain over the past month   Plantar Fascitis: he had a long day at work around Thanksgiving and his right heel has been painful, worse in am's but improves after he walks. No redness or swelling   Right knee effusion: He was working last week, kneeling down, for the past week right knee has been swollen and tender, he has an antalgic gait, no redness or increase in warmth. He has difficulty squatting down    Patient Active Problem List   Diagnosis Date Noted   Pre-diabetes 04/16/2018   Panic attack 05/20/2016   Abnormal EKG 05/18/2016   Anxiety about health 05/08/2016   Internal hemorrhoids 12/29/2015   Back pain, chronic 01/18/2015   Gastro-esophageal reflux disease without esophagitis 01/18/2015   Numerous moles 01/18/2015   Hyperglycemia 01/18/2015   History of fracture of femur 01/18/2015    Social History   Tobacco Use   Smoking status: Former    Packs/day: 1.00    Years: 2.00    Pack years: 2.00    Types: Cigarettes    Quit date: 01/30/1988    Years since quitting: 32.9   Smokeless tobacco: Never  Substance Use Topics   Alcohol use: Yes    Alcohol/week: 5.0 standard drinks    Types: 5 Cans of beer per week     Current Outpatient Medications:    citalopram  (CELEXA) 20 MG tablet, Take 1 tablet (20 mg total) by mouth daily., Disp: 90 tablet, Rfl: 3   famotidine (PEPCID) 40 MG tablet, Take 1 tablet (40 mg total) by mouth daily., Disp: 180 tablet, Rfl: 1   losartan (COZAAR) 25 MG tablet, Take 1 tablet (25 mg total) by mouth daily., Disp: 90 tablet, Rfl: 3   Multiple Vitamin (MULTIVITAMINS PO), Take 1 tablet by mouth daily., Disp: , Rfl:   Allergies  Allergen Reactions   Ace Inhibitors Cough    ROS  Ten systems reviewed and is negative except as mentioned in HPI  Objective  Vitals:   01/18/21 1458 01/18/21 1506  BP: (!) 182/110 (!) 182/106  Pulse: 81 84  Resp: 18 18  Temp:  97.6 F (36.4 C)  TempSrc:  Oral  SpO2: 99% 99%  Weight:  198 lb (89.8 kg)  Height:  5' 6"  (1.676 m)    Body mass index is 31.96 kg/m.    Physical Exam  Constitutional: Patient appears well-developed and well-nourished. Obese  No distress.  HEENT: head atraumatic, normocephalic, pupils equal and reactive to light, neck supple Cardiovascular: Normal rate, regular rhythm and normal heart sounds.  No murmur heard. No BLE edema. Pulmonary/Chest: Effort normal and breath sounds normal. No respiratory distress.  Abdominal: Soft.  There is no tenderness. Muscular Skeletal: effusion right knee, pain with rom, pain is worse on medial aspect, pain also with deep palpation of plantar fascia, about 2 inches distally from heel in the medial aspect of right foot  Psychiatric: Patient has a normal mood and affect. behavior is normal. Judgment and thought content normal.   Recent Results (from the past 2160 hour(s))  Comprehensive Metabolic Panel (CMET)     Status: None   Collection Time: 12/26/20  4:13 PM  Result Value Ref Range   Glucose 88 70 - 99 mg/dL   BUN 18 6 - 24 mg/dL   Creatinine, Ser 1.07 0.76 - 1.27 mg/dL   eGFR 82 >59 mL/min/1.73   BUN/Creatinine Ratio 17 9 - 20   Sodium 140 134 - 144 mmol/L   Potassium 4.7 3.5 - 5.2 mmol/L   Chloride 101 96 - 106  mmol/L   CO2 21 20 - 29 mmol/L   Calcium 9.2 8.7 - 10.2 mg/dL   Total Protein 6.4 6.0 - 8.5 g/dL   Albumin 4.4 3.8 - 4.9 g/dL   Globulin, Total 2.0 1.5 - 4.5 g/dL   Albumin/Globulin Ratio 2.2 1.2 - 2.2   Bilirubin Total 0.5 0.0 - 1.2 mg/dL   Alkaline Phosphatase 112 44 - 121 IU/L   AST 27 0 - 40 IU/L   ALT 43 0 - 44 IU/L  CBC with Differential/Platelet     Status: None   Collection Time: 12/26/20  4:17 PM  Result Value Ref Range   WBC 6.5 3.4 - 10.8 x10E3/uL   RBC 5.35 4.14 - 5.80 x10E6/uL   Hemoglobin 15.8 13.0 - 17.7 g/dL   Hematocrit 46.6 37.5 - 51.0 %   MCV 87 79 - 97 fL   MCH 29.5 26.6 - 33.0 pg   MCHC 33.9 31.5 - 35.7 g/dL   RDW 12.2 11.6 - 15.4 %   Platelets 213 150 - 450 x10E3/uL   Neutrophils 55 Not Estab. %   Lymphs 33 Not Estab. %   Monocytes 9 Not Estab. %   Eos 1 Not Estab. %   Basos 1 Not Estab. %   Neutrophils Absolute 3.6 1.4 - 7.0 x10E3/uL   Lymphocytes Absolute 2.1 0.7 - 3.1 x10E3/uL   Monocytes Absolute 0.6 0.1 - 0.9 x10E3/uL   EOS (ABSOLUTE) 0.1 0.0 - 0.4 x10E3/uL   Basophils Absolute 0.1 0.0 - 0.2 x10E3/uL   Immature Granulocytes 1 Not Estab. %   Immature Grans (Abs) 0.0 0.0 - 0.1 x10E3/uL  Hemoglobin A1c     Status: Abnormal   Collection Time: 12/26/20  4:17 PM  Result Value Ref Range   Hgb A1c MFr Bld 5.9 (H) 4.8 - 5.6 %    Comment:          Prediabetes: 5.7 - 6.4          Diabetes: >6.4          Glycemic control for adults with diabetes: <7.0    Est. average glucose Bld gHb Est-mCnc 123 mg/dL  Lipid panel     Status: Abnormal   Collection Time: 12/26/20  4:17 PM  Result Value Ref Range   Cholesterol, Total 220 (H) 100 - 199 mg/dL   Triglycerides 322 (H) 0 - 149 mg/dL   HDL 41 >39 mg/dL   VLDL Cholesterol Cal 57 (H) 5 - 40 mg/dL   LDL Chol Calc (NIH) 122 (H) 0 - 99 mg/dL   Chol/HDL Ratio 5.4 (H) 0.0 -  5.0 ratio    Comment:                                   T. Chol/HDL Ratio                                             Men  Women                                1/2 Avg.Risk  3.4    3.3                                   Avg.Risk  5.0    4.4                                2X Avg.Risk  9.6    7.1                                3X Avg.Risk 23.4   11.0      Assessment & Plan  1. Uncontrolled hypertension  Explained the pain may be causing bp to spike. Reviewed last labs and previous vital signs. We will give him norvasc to only take prn if bp remains elevated with pain control  - amLODipine (NORVASC) 2.5 MG tablet; Take 1-2 tablets (2.5-5 mg total) by mouth daily.  Dispense: 30 tablet; Refill: 0  2. Effusion of right knee  - Ambulatory referral to Orthopedic Surgery  3. Plantar fasciitis, right  - diclofenac (VOLTAREN) 75 MG EC tablet; Take 1 tablet (75 mg total) by mouth 2 (two) times daily.  Dispense: 28 tablet; Refill: 0 - Ambulatory referral to Orthopedic Surgery  4. Pain and swelling of right knee  - HYDROcodone-Acetaminophen 5-300 MG TABS; Take 1 tablet by mouth 4 (four) times daily as needed.  Dispense: 20 tablet; Refill: 0 - diclofenac (VOLTAREN) 75 MG EC tablet; Take 1 tablet (75 mg total) by mouth 2 (two) times daily.  Dispense: 28 tablet; Refill: 0 - Ambulatory referral to Orthopedic Surgery

## 2021-01-18 NOTE — Patient Instructions (Addendum)
Take new bp medication if bp above 150/90 Try pain medication hydrocodone first to see if bp improves May take diclofenac 75 mg bid with food if bp improves and still having pain   You can take max of 10 mg of amlodipine ( bp medication ) per day if bp stays above 150/90, try to take 12 hours apart   Hydrocodone has tylenol in it, therefore only take a max of 3 g of tylenol per day as otc Tylenol or the amount with hydrocodone-apap  ( Vicodin)

## 2021-01-18 NOTE — Addendum Note (Signed)
Addended by: Steele Sizer F on: 01/18/2021 04:15 PM   Modules accepted: Orders

## 2021-01-18 NOTE — Progress Notes (Deleted)
Patient presented for BP check as he stated his machine at home was reading high and he wanted to verify accuracy. Patient sat 5 minutes prior to testing. Re-tested 10 minutes later. PCP Dr. Ancil Boozer notified of results and visit converted to PCP/office visit.

## 2021-01-19 DIAGNOSIS — M25561 Pain in right knee: Secondary | ICD-10-CM | POA: Diagnosis not present

## 2021-01-19 DIAGNOSIS — M2391 Unspecified internal derangement of right knee: Secondary | ICD-10-CM | POA: Diagnosis not present

## 2021-01-20 ENCOUNTER — Other Ambulatory Visit: Payer: Self-pay

## 2021-01-20 ENCOUNTER — Ambulatory Visit: Payer: BLUE CROSS/BLUE SHIELD

## 2021-01-20 VITALS — BP 160/94

## 2021-01-20 DIAGNOSIS — Z0130 Encounter for examination of blood pressure without abnormal findings: Secondary | ICD-10-CM

## 2021-01-20 NOTE — Progress Notes (Signed)
Dr. Ancil Boozer aware of BP and went over BP med instructions w/pt wife.

## 2021-01-25 ENCOUNTER — Other Ambulatory Visit: Payer: Self-pay | Admitting: Family Medicine

## 2021-01-25 ENCOUNTER — Ambulatory Visit: Payer: BLUE CROSS/BLUE SHIELD

## 2021-01-25 DIAGNOSIS — I10 Essential (primary) hypertension: Secondary | ICD-10-CM

## 2021-03-26 ENCOUNTER — Other Ambulatory Visit: Payer: Self-pay | Admitting: Family Medicine

## 2021-03-26 DIAGNOSIS — F411 Generalized anxiety disorder: Secondary | ICD-10-CM

## 2021-03-26 DIAGNOSIS — F410 Panic disorder [episodic paroxysmal anxiety] without agoraphobia: Secondary | ICD-10-CM

## 2021-07-31 DIAGNOSIS — D2261 Melanocytic nevi of right upper limb, including shoulder: Secondary | ICD-10-CM | POA: Diagnosis not present

## 2021-07-31 DIAGNOSIS — D225 Melanocytic nevi of trunk: Secondary | ICD-10-CM | POA: Diagnosis not present

## 2021-07-31 DIAGNOSIS — D2272 Melanocytic nevi of left lower limb, including hip: Secondary | ICD-10-CM | POA: Diagnosis not present

## 2021-07-31 DIAGNOSIS — D485 Neoplasm of uncertain behavior of skin: Secondary | ICD-10-CM | POA: Diagnosis not present

## 2021-07-31 DIAGNOSIS — D2262 Melanocytic nevi of left upper limb, including shoulder: Secondary | ICD-10-CM | POA: Diagnosis not present

## 2021-08-16 ENCOUNTER — Ambulatory Visit (INDEPENDENT_AMBULATORY_CARE_PROVIDER_SITE_OTHER): Payer: BLUE CROSS/BLUE SHIELD

## 2021-08-16 ENCOUNTER — Ambulatory Visit: Payer: BLUE CROSS/BLUE SHIELD | Admitting: Podiatry

## 2021-08-16 DIAGNOSIS — M722 Plantar fascial fibromatosis: Secondary | ICD-10-CM | POA: Diagnosis not present

## 2021-08-16 DIAGNOSIS — M7751 Other enthesopathy of right foot: Secondary | ICD-10-CM

## 2021-08-16 DIAGNOSIS — M7750 Other enthesopathy of unspecified foot: Secondary | ICD-10-CM

## 2021-08-21 MED ORDER — MELOXICAM 15 MG PO TABS: 15.0000 mg | ORAL_TABLET | Freq: Every day | ORAL | 3 refills | Status: DC

## 2021-08-21 NOTE — Progress Notes (Signed)
  Subjective:  Patient ID: Kevin Bernard, male    DOB: 13-Jan-1966,  MRN: [id number]  Chief Complaint  Patient presents with   Plantar Fasciitis      NP  R ft PF foot pain    56 y.o. male presents with the above complaint. History confirmed with patient.  Notes the pain radiates up the arch into the toes  Objective:  Physical Exam: warm, good capillary refill, no trophic changes or ulcerative lesions, normal DP and PT pulses, and normal sensory exam. Left Foot: normal exam, no swelling, tenderness, instability; ligaments intact, full range of motion of all ankle/foot joints Right Foot: point tenderness over the heel pad and point tenderness of the mid plantar fascia  No images are attached to the encounter.  Radiographs: Multiple views x-ray of the right foot: no fracture, dislocation, swelling or degenerative changes noted and plantar calcaneal spur Assessment:   1. Plantar fasciitis, right      Plan:  Patient was evaluated and treated and all questions answered.  Discussed the etiology and treatment options for plantar fasciitis including stretching, formal physical therapy, supportive shoegears such as a running shoe or sneaker, pre fabricated orthoses, injection therapy, and oral medications. We also discussed the role of surgical treatment of this for patients who do not improve after exhausting non-surgical treatment options.   -XR reviewed with patient -Educated patient on stretching and icing of the affected limb -Injection delivered to the plantar fascia of the left foot. -Rx for meloxicam. Educated on use, risks and benefits of the medication  After sterile prep with povidone-iodine solution and alcohol, the right heel was injected with 0.5cc 2% xylocaine plain, 0.5cc 0.5% marcaine plain, '5mg'$  triamcinolone acetonide, and '2mg'$  dexamethasone was injected along the medial plantar fascia at the insertion on the plantar calcaneus. The patient tolerated the procedure well  without complication.  No follow-ups on file.

## 2021-08-21 NOTE — Patient Instructions (Signed)

## 2021-09-05 NOTE — Progress Notes (Unsigned)
Name: Kevin Bernard   MRN: [id number]    DOB: 02/16/1965   Date:09/06/2021       Progress Note  Subjective  Chief Complaint  Follow Up  HPI  HTN: he is currently taking losartan 25 mg daily , last year in Dec his bp was spiking and we added low dose norvasc, however he is only taking it prn when bp goes up - he states he can fell the change. He checks bp about once a week and is usually 141/90 average  He denies chest pain , palpitation or SOB. We will adjust dose of losartan to 50 mg daily   Plantar Fascitis: symptoms started around Thanksgiving 2022 , taking meloxicam, had steroid injection and wearing a brace and is feeling better now    Dyslipidemia: improved and risk is below 10 % , not on statin therapy   The 10-year ASCVD risk score (Arnett DK, et al., 2019) is: 9.6%   Values used to calculate the score:     Age: 56 years     Sex: Male     Is Non-Hispanic African American: No     Diabetic: No     Tobacco smoker: No     Systolic Blood Pressure: 188 mmHg     Is BP treated: Yes     HDL Cholesterol: 41 mg/dL     Total Cholesterol: 220 mg/dL    GAD: he tried Zoloft but did not control symptoms, responded to Prozac but it caused tremors, since 07/2016 he has been on Citalopram and he has been toleraing it well, no side effects. No panic attacks    GERD: he is now on Famotidine  once a day, no heartburn or indigestion as long as he takes medication once a day, he notices symptoms if he skips one dose   Pre-diabetes: A1C has been elevated, lit went from 5.8 % to 5.7 % and last visit up to 5.9 %  , he is active at his yard, also eating smaller portion  Patient Active Problem List   Diagnosis Date Noted   Derangement of right knee 01/19/2021   Pre-diabetes 04/16/2018   Panic attack 05/20/2016   Abnormal EKG 05/18/2016   Anxiety about health 05/08/2016   Internal hemorrhoids 12/29/2015   Back pain, chronic 01/18/2015   Gastro-esophageal reflux disease without esophagitis  01/18/2015   Numerous moles 01/18/2015   Hyperglycemia 01/18/2015   History of fracture of femur 01/18/2015    Past Surgical History:  Procedure Laterality Date   APPENDECTOMY     COLONOSCOPY WITH PROPOFOL N/A 01/18/2020   Procedure: COLONOSCOPY WITH PROPOFOL;  Surgeon: Lin Landsman, MD;  Location: [place];  Service: Gastroenterology;  Laterality: N/A;   right femur internal fixiation     VASECTOMY      Family History  Problem Relation Age of Onset   CVA Mother    Cancer Father 26       Pancreatic   Diabetes Father    Depression Brother    Depression Daughter    Epilepsy Brother     Social History   Tobacco Use   Smoking status: Former    Packs/day: 1.00    Years: 2.00    Total pack years: 2.00    Types: Cigarettes    Quit date: 01/30/1988    Years since quitting: 33.6   Smokeless tobacco: Never  Substance Use Topics   Alcohol use: Yes    Alcohol/week: 5.0 standard drinks of alcohol  Types: 5 Cans of beer per week     Current Outpatient Medications:    amLODipine (NORVASC) 2.5 MG tablet, Take 1-2 tablets (2.5-5 mg total) by mouth daily., Disp: 30 tablet, Rfl: 0   citalopram (CELEXA) 20 MG tablet, TAKE 1 TABLET BY MOUTH EVERY DAY, Disp: 90 tablet, Rfl: 1   famotidine (PEPCID) 40 MG tablet, Take 1 tablet (40 mg total) by mouth daily., Disp: 180 tablet, Rfl: 1   HYDROcodone-Acetaminophen 5-300 MG TABS, Take 1 tablet by mouth 4 (four) times daily as needed., Disp: 20 tablet, Rfl: 0   losartan (COZAAR) 25 MG tablet, Take 1 tablet (25 mg total) by mouth daily., Disp: 90 tablet, Rfl: 3   meloxicam (MOBIC) 15 MG tablet, Take 1 tablet (15 mg total) by mouth daily., Disp: 30 tablet, Rfl: 3   Multiple Vitamin (MULTIVITAMINS PO), Take 1 tablet by mouth daily., Disp: , Rfl:   Allergies  Allergen Reactions   Ace Inhibitors Cough    I personally reviewed active problem list, medication list, allergies, family history, social history, health maintenance with  the patient/caregiver today.   ROS  Constitutional: Negative for fever or weight change.  Respiratory: Negative for cough and shortness of breath.   Cardiovascular: Negative for chest pain or palpitations.  Gastrointestinal: Negative for abdominal pain, no bowel changes.  Musculoskeletal: Negative for gait problem or joint swelling.  Skin: Negative for rash.  Neurological: Negative for dizziness or headache.  No other specific complaints in a complete review of systems (except as listed in HPI above).   Objective  Vitals:   09/06/21 1447  BP: 136/82  Pulse: 88  Resp: 16  SpO2: 96%  Weight: 203 lb (92.1 kg)  Height: '5\' 6"'$  (1.676 m)    Body mass index is 32.77 kg/m.  Physical Exam  Constitutional: Patient appears well-developed and well-nourished. Obese  No distress.  HEENT: head atraumatic, normocephalic, pupils equal and reactive to light, neck supple Cardiovascular: Normal rate, regular rhythm and normal heart sounds.  No murmur heard. No BLE edema. Pulmonary/Chest: Effort normal and breath sounds normal. No respiratory distress. Abdominal: Soft.  There is no tenderness. Psychiatric: Patient has a normal mood and affect. behavior is normal. Judgment and thought content normal.   PHQ2/9:    09/06/2021    2:47 PM 01/18/2021    3:18 PM 12/06/2020    1:56 PM 09/07/2020    2:27 PM 11/24/2019    3:20 PM  Depression screen PHQ 2/9  Decreased Interest 0 0 0 0 0  Down, Depressed, Hopeless 0 0 0 0 0  PHQ - 2 Score 0 0 0 0 0  Altered sleeping 3 0 0  0  Tired, decreased energy 3 0 0  0  Change in appetite 0 0 0  0  Feeling bad or failure about yourself  0 0 0  0  Trouble concentrating 0 0 0  0  Moving slowly or fidgety/restless 0 0 0  0  Suicidal thoughts 0 0 0  0  PHQ-9 Score 6 0 0  0    phq 9 is negative   Fall Risk:    09/06/2021    2:47 PM 01/18/2021    3:18 PM 12/06/2020    1:56 PM 09/07/2020    2:27 PM 11/24/2019    3:24 PM  [place] in the past  year? 0 0 0 0 0  Number falls in past yr: 0 0 0 0 0  Injury with Fall? 0  0 0 0 1  Risk for fall due to : No Fall Risks  No Fall Risks    Follow up Falls prevention discussed Falls prevention discussed Falls prevention discussed        Functional Status Survey: Is the patient deaf or have difficulty hearing?: No Does the patient have difficulty seeing, even when wearing glasses/contacts?: No Does the patient have difficulty concentrating, remembering, or making decisions?: No Does the patient have difficulty walking or climbing stairs?: No Does the patient have difficulty dressing or bathing?: No Does the patient have difficulty doing errands alone such as visiting a doctor's office or shopping?: No    Assessment & Plan  1. Hypertension, benign  - losartan (COZAAR) 50 MG tablet; Take 1 tablet (25 mg total) by mouth daily.  Dispense: 90 tablet; Refill: 3   2. GAD (generalized anxiety disorder)  - citalopram (CELEXA) 20 MG tablet; Take 1 tablet (20 mg total) by mouth daily.  Dispense: 90 tablet; Refill: 1  3. Panic attack  - citalopram (CELEXA) 20 MG tablet; Take 1 tablet (20 mg total) by mouth daily.  Dispense: 90 tablet; Refill: 1  4. Gastro-esophageal reflux disease without esophagitis  - famotidine (PEPCID) 40 MG tablet; Take 1 tablet (40 mg total) by mouth daily.  Dispense: 90 tablet; Refill: 1  5. Plantar fasciitis, right  Keep follow up with podiatrist

## 2021-09-06 ENCOUNTER — Ambulatory Visit: Payer: BLUE CROSS/BLUE SHIELD | Admitting: Family Medicine

## 2021-09-06 ENCOUNTER — Encounter: Payer: Self-pay | Admitting: Family Medicine

## 2021-09-06 VITALS — BP 136/82 | HR 88 | Resp 16 | Ht 66.0 in | Wt 203.0 lb

## 2021-09-06 DIAGNOSIS — F411 Generalized anxiety disorder: Secondary | ICD-10-CM

## 2021-09-06 DIAGNOSIS — K219 Gastro-esophageal reflux disease without esophagitis: Secondary | ICD-10-CM

## 2021-09-06 DIAGNOSIS — F410 Panic disorder [episodic paroxysmal anxiety] without agoraphobia: Secondary | ICD-10-CM

## 2021-09-06 DIAGNOSIS — I10 Essential (primary) hypertension: Secondary | ICD-10-CM | POA: Diagnosis not present

## 2021-09-06 DIAGNOSIS — M722 Plantar fascial fibromatosis: Secondary | ICD-10-CM

## 2021-09-06 MED ORDER — LOSARTAN POTASSIUM 25 MG PO TABS: 25.0000 mg | ORAL_TABLET | Freq: Every day | ORAL | 3 refills | Status: DC

## 2021-09-06 MED ORDER — CITALOPRAM HYDROBROMIDE 20 MG PO TABS: 20.0000 mg | ORAL_TABLET | Freq: Every day | ORAL | 1 refills | Status: DC

## 2021-09-06 MED ORDER — FAMOTIDINE 40 MG PO TABS: 40.0000 mg | ORAL_TABLET | Freq: Every day | ORAL | 1 refills | Status: DC

## 2021-09-06 MED ORDER — LOSARTAN POTASSIUM 50 MG PO TABS: 50.0000 mg | ORAL_TABLET | Freq: Every day | ORAL | 1 refills | Status: DC

## 2021-09-13 ENCOUNTER — Ambulatory Visit: Payer: BLUE CROSS/BLUE SHIELD | Admitting: Podiatry

## 2021-09-13 ENCOUNTER — Telehealth: Payer: Self-pay | Admitting: Podiatry

## 2021-09-13 NOTE — Telephone Encounter (Signed)
Pt left message  @ 1218pm today stating he needed to cancel his appt.   I returned call and left message for pt that the I canceled the appt and to call if he needs to r/s

## 2021-12-15 NOTE — Progress Notes (Deleted)
Name: NOCHUM FENTER   MRN: [id number]    DOB: [birth year]   Date:12/15/2021       Progress Note  Subjective  Chief Complaint  Annual Exam  HPI  Patient presents for annual CPE.  IPSS Questionnaire (AUA-7): Over the past month.   1)  How often have you had a sensation of not emptying your bladder completely after you finish urinating?  {Rating:19227}  2)  How often have you had to urinate again less than two hours after you finished urinating? {Rating:19227}  3)  How often have you found you stopped and started again several times when you urinated?  {Rating:19227}  4) How difficult have you found it to postpone urination?  {Rating:19227}  5) How often have you had a weak urinary stream?  {Rating:19227}  6) How often have you had to push or strain to begin urination?  {Rating:19227}  7) How many times did you most typically get up to urinate from the time you went to bed until the time you got up in the morning?  {Rating:19228}  Total score:  0-7 mildly symptomatic   8-19 moderately symptomatic   20-35 severely symptomatic     Diet: *** Exercise: *** Last Dental Exam: **** Last Eye Exam: ***  Depression: phq 9 is {gen pos [id number]}    09/06/2021    2:47 PM 01/18/2021    3:18 PM 12/06/2020    1:56 PM 09/07/2020    2:27 PM 11/24/2019    3:20 PM  Depression screen PHQ 2/9  Decreased Interest 0 0 0 0 0  Down, Depressed, Hopeless 0 0 0 0 0  PHQ - 2 Score 0 0 0 0 0  Altered sleeping 3 0 0  0  Tired, decreased energy 3 0 0  0  Change in appetite 0 0 0  0  Feeling bad or failure about yourself  0 0 0  0  Trouble concentrating 0 0 0  0  Moving slowly or fidgety/restless 0 0 0  0  Suicidal thoughts 0 0 0  0  PHQ-9 Score 6 0 0  0    Hypertension:  BP Readings from Last 3 Encounters:  09/06/21 136/82  01/20/21 (!) 160/94  01/18/21 (!) 182/106    Obesity: Wt Readings from Last 3 Encounters:  09/06/21 203 lb (92.1 kg)  01/18/21 198 lb (89.8 kg)  12/06/20 198 lb (89.8 kg)    BMI Readings from Last 3 Encounters:  09/06/21 32.77 kg/m  01/18/21 31.96 kg/m  12/06/20 31.96 kg/m     Lipids:  Lab Results  Component Value Date   CHOL 220 (H) 12/26/2020   CHOL 209 (H) 11/24/2019   CHOL 210 (H) 04/14/2018   Lab Results  Component Value Date   HDL 41 12/26/2020   HDL 41 11/24/2019   HDL 39 (L) 04/14/2018   Lab Results  Component Value Date   LDLCALC 122 (H) 12/26/2020   LDLCALC 125 (H) 11/24/2019   LDLCALC 142 (H) 04/14/2018   Lab Results  Component Value Date   TRIG 322 (H) 12/26/2020   TRIG 298 (H) 11/24/2019   TRIG 155 (H) 04/14/2018   Lab Results  Component Value Date   CHOLHDL 5.4 (H) 12/26/2020   CHOLHDL 5.1 (H) 11/24/2019   CHOLHDL 5.4 (H) 04/14/2018   No results found for: "LDLDIRECT" Glucose:  Glucose  Date Value Ref Range Status  12/26/2020 88 70 - 99 mg/dL Final  11/07/2017 84 65 - 99 mg/dL Final  Glucose, Bld  Date Value Ref Range Status  11/24/2019 79 65 - 99 mg/dL Final    Comment:    .            Fasting reference interval .   04/14/2018 85 65 - 99 mg/dL Final    Comment:    .            Fasting reference interval .   04/09/2017 96 65 - 139 mg/dL Final    Comment:    .        Non-fasting reference interval .     [place] Office Visit from 01/18/2021 in [facility]  AUDIT-C Score 2      Married STD testing and prevention (HIV/chl/gon/syphilis): N/A Sexual history:  Hep C Screening: 04/14/18 Skin cancer: Discussed monitoring for atypical lesions Colorectal cancer: 01/18/20 Prostate cancer:   Lab Results  Component Value Date   PSA 1.60 11/24/2019   PSA 3.1 04/14/2018   PSA 1.3 04/09/2017     Lung cancer:  Low Dose CT Chest recommended if Age 33-80 years, 30 pack-year currently smoking OR have quit w/in 15years. Patient  no a candidate for screening   AAA: The USPSTF recommends one-time screening with ultrasonography in men ages 24 to 24 years who have ever smoked.  Patient   no, a candidate for screening  ECG:  05/14/16  Vaccines:   HPV: N/A Tdap: up to date Shingrix: up to date Pneumonia: N/A Flu: Never COVID-19: 2 Pfizer dose  Advanced Care Planning: A voluntary discussion about advance care planning including the explanation and discussion of advance directives.  Discussed health care proxy and Living will, and the patient was able to identify a health care proxy as ***.  Patient does not have a living will and power of attorney of health care   Patient Active Problem List   Diagnosis Date Noted   Derangement of right knee 01/19/2021   Pre-diabetes 04/16/2018   Panic attack 05/20/2016   Abnormal EKG 05/18/2016   Anxiety about health 05/08/2016   Internal hemorrhoids 12/29/2015   Back pain, chronic 01/18/2015   Gastro-esophageal reflux disease without esophagitis 01/18/2015   Numerous moles 01/18/2015   Hyperglycemia 01/18/2015   History of fracture of femur 01/18/2015    Past Surgical History:  Procedure Laterality Date   APPENDECTOMY     COLONOSCOPY WITH PROPOFOL N/A 01/18/2020   Procedure: COLONOSCOPY WITH PROPOFOL;  Surgeon: Lin Landsman, MD;  Location: [place];  Service: Gastroenterology;  Laterality: N/A;   right femur internal fixiation     VASECTOMY      Family History  Problem Relation Age of Onset   CVA Mother    Cancer Father 65       Pancreatic   Diabetes Father    Depression Brother    Depression Daughter    Epilepsy Brother     Social History   Socioeconomic History   Marital status: Married    Spouse name: Abigail Butts   Number of children: 4   Years of education: Not on file   Highest education level: Some college, no degree  Occupational History   Occupation: Therapist, music: [employer]  Tobacco Use   Smoking status: Former    Packs/day: 1.00    Years: 2.00    Total pack years: 2.00    Types: Cigarettes    Quit date: 01/30/1988    Years since quitting: 33.8    Smokeless tobacco:  Never  Vaping Use   Vaping Use: Never used  Substance and Sexual Activity   Alcohol use: Yes    Alcohol/week: 5.0 standard drinks of alcohol    Types: 5 Cans of beer per week   Drug use: No   Sexual activity: Yes    Partners: Female    Birth control/protection: None  Other Topics Concern   Not on file  Social History Narrative   Married, they have 4 children.    Social Determinants of Health   Financial Resource Strain: Low Risk  (12/06/2020)   Overall Financial Resource Strain (CARDIA)    Difficulty of Paying Living Expenses: Not very hard  Food Insecurity: No Food Insecurity (12/06/2020)   Hunger Vital Sign    Worried About Running Out of Food in the Last Year: Never true    Ran Out of Food in the Last Year: Never true  Transportation Needs: No Transportation Needs (12/06/2020)   PRAPARE - Hydrologist (Medical): No    Lack of Transportation (Non-Medical): No  Physical Activity: Insufficiently Active (12/06/2020)   Exercise Vital Sign    Days of Exercise per Week: 2 days    Minutes of Exercise per Session: 20 min  Stress: No Stress Concern Present (12/06/2020)   [place]    Feeling of Stress : Only a little  Social Connections: Moderately Integrated (12/06/2020)   Social Connection and Isolation Panel [NHANES]    Frequency of Communication with Friends and Family: More than three times a week    Frequency of Social Gatherings with Friends and Family: Once a week    Attends Religious Services: More than 4 times per year    Active Member of [employer] or Organizations: No    Attends Archivist Meetings: Never    Marital Status: Married  Human resources officer Violence: Not At Risk (12/06/2020)   Humiliation, Afraid, Rape, and Kick questionnaire    Fear of Current or Ex-Partner: No    Emotionally Abused: No    Physically Abused: No    Sexually Abused: No      Current Outpatient Medications:    amLODipine (NORVASC) 2.5 MG tablet, Take 1-2 tablets (2.5-5 mg total) by mouth daily., Disp: 30 tablet, Rfl: 0   citalopram (CELEXA) 20 MG tablet, Take 1 tablet (20 mg total) by mouth daily., Disp: 90 tablet, Rfl: 1   famotidine (PEPCID) 40 MG tablet, Take 1 tablet (40 mg total) by mouth daily., Disp: 90 tablet, Rfl: 1   losartan (COZAAR) 50 MG tablet, Take 1 tablet (50 mg total) by mouth daily., Disp: 90 tablet, Rfl: 1   meloxicam (MOBIC) 15 MG tablet, Take 1 tablet (15 mg total) by mouth daily., Disp: 30 tablet, Rfl: 3   Multiple Vitamin (MULTIVITAMINS PO), Take 1 tablet by mouth daily., Disp: , Rfl:   Allergies  Allergen Reactions   Ace Inhibitors Cough     ROS  ***   Objective  There were no vitals filed for this visit.  There is no height or weight on file to calculate BMI.  Physical Exam ***  No results found for this or any previous visit (from the past 2160 hour(s)).   Fall Risk:    09/06/2021    2:47 PM 01/18/2021    3:18 PM 12/06/2020    1:56 PM 09/07/2020    2:27 PM 11/24/2019    3:24 PM  Fall Risk   Falls in the  past year? 0 0 0 0 0  Number falls in past yr: 0 0 0 0 0  Injury with Fall? 0 0 0 0 1  Risk for fall due to : No Fall Risks  No Fall Risks    Follow up Falls prevention discussed Falls prevention discussed Falls prevention discussed       Functional Status Survey:      Assessment & Plan  1. Well adult exam ***    -Prostate cancer screening and PSA options (with potential risks and benefits of testing vs not testing) were discussed along with recent recs/guidelines. -USPSTF grade A and B recommendations reviewed with patient; age-appropriate recommendations, preventive care, screening tests, etc discussed and encouraged; healthy living encouraged; see AVS for patient education given to patient -Discussed importance of 150 minutes of physical activity weekly, eat two servings of fish weekly, eat one  serving of tree nuts ( cashews, pistachios, pecans, almonds.Marland Kitchen) every other day, eat 6 servings of fruit/vegetables daily and drink plenty of water and avoid sweet beverages.  -Reviewed Health Maintenance: yes

## 2021-12-15 NOTE — Patient Instructions (Incomplete)

## 2021-12-18 ENCOUNTER — Encounter: Payer: BLUE CROSS/BLUE SHIELD | Admitting: Family Medicine

## 2021-12-18 DIAGNOSIS — Z0000 Encounter for general adult medical examination without abnormal findings: Secondary | ICD-10-CM

## 2022-01-05 ENCOUNTER — Other Ambulatory Visit: Payer: Self-pay | Admitting: Podiatry

## 2022-01-23 ENCOUNTER — Ambulatory Visit: Payer: BLUE CROSS/BLUE SHIELD | Admitting: Family Medicine

## 2022-01-23 ENCOUNTER — Encounter: Payer: Self-pay | Admitting: Family Medicine

## 2022-01-23 VITALS — BP 128/72 | HR 98 | Resp 16 | Ht 66.0 in | Wt 194.0 lb

## 2022-01-23 DIAGNOSIS — J069 Acute upper respiratory infection, unspecified: Secondary | ICD-10-CM | POA: Diagnosis not present

## 2022-01-23 DIAGNOSIS — K219 Gastro-esophageal reflux disease without esophagitis: Secondary | ICD-10-CM | POA: Diagnosis not present

## 2022-01-23 DIAGNOSIS — J209 Acute bronchitis, unspecified: Secondary | ICD-10-CM

## 2022-01-23 MED ORDER — DOXYCYCLINE HYCLATE 100 MG PO TABS: 100.0000 mg | ORAL_TABLET | Freq: Two times a day (BID) | ORAL | 0 refills | Status: AC

## 2022-01-23 MED ORDER — BENZONATATE 100 MG PO CAPS: 100.0000 mg | ORAL_CAPSULE | Freq: Three times a day (TID) | ORAL | 1 refills | Status: DC | PRN

## 2022-01-23 MED ORDER — PREDNISONE 20 MG PO TABS: 40.0000 mg | ORAL_TABLET | Freq: Every day | ORAL | 0 refills | Status: AC

## 2022-01-23 MED ORDER — PANTOPRAZOLE SODIUM 20 MG PO TBEC: 20.0000 mg | DELAYED_RELEASE_TABLET | Freq: Two times a day (BID) | ORAL | 0 refills | Status: DC

## 2022-01-23 MED ORDER — ALBUTEROL SULFATE HFA 108 (90 BASE) MCG/ACT IN AERS: 2.0000 | INHALATION_SPRAY | RESPIRATORY_TRACT | 0 refills | Status: DC | PRN

## 2022-01-23 NOTE — Progress Notes (Signed)
Patient ID: Kevin Bernard, male    DOB: 1965-05-22, 56 y.o.   MRN: [id number]  PCP: Steele Sizer, MD  Chief Complaint  Patient presents with   Cough    X2 weeks, productive   Nasal Congestion    X2 weeks    Subjective:   Kevin Bernard is a 56 y.o. male, presents to clinic with CC of the following:  HPI  Patient presents with sinus pain congestion and cough onset of symptoms more than 2 weeks ago he states his cough is productive does feel hard to breathe but reports the shortness of breath is partially because he cannot breathe through his nose He reports severe congestion facial pressure and pain he did have other symptoms but they resolved he states that he has tried "everything" over-the-counter Former smoker, reports remote history of pneumonia He has not been exposed to [employer] that he knows of but he does travel extensively No recent fever, denies CP with deep inspiration  Patient Active Problem List   Diagnosis Date Noted   Derangement of right knee 01/19/2021   Pre-diabetes 04/16/2018   Panic attack 05/20/2016   Abnormal EKG 05/18/2016   Anxiety about health 05/08/2016   Internal hemorrhoids 12/29/2015   Back pain, chronic 01/18/2015   Gastro-esophageal reflux disease without esophagitis 01/18/2015   Numerous moles 01/18/2015   Hyperglycemia 01/18/2015   History of fracture of femur 01/18/2015      Current Outpatient Medications:    amLODipine (NORVASC) 2.5 MG tablet, Take 1-2 tablets (2.5-5 mg total) by mouth daily., Disp: 30 tablet, Rfl: 0   citalopram (CELEXA) 20 MG tablet, Take 1 tablet (20 mg total) by mouth daily., Disp: 90 tablet, Rfl: 1   famotidine (PEPCID) 40 MG tablet, Take 1 tablet (40 mg total) by mouth daily., Disp: 90 tablet, Rfl: 1   losartan (COZAAR) 50 MG tablet, Take 1 tablet (50 mg total) by mouth daily., Disp: 90 tablet, Rfl: 1   meloxicam (MOBIC) 15 MG tablet, TAKE 1 TABLET (15 MG TOTAL) BY MOUTH DAILY., Disp: 30 tablet, Rfl: 3    Multiple Vitamin (MULTIVITAMINS PO), Take 1 tablet by mouth daily., Disp: , Rfl:    Allergies  Allergen Reactions   Ace Inhibitors Cough     Social History   Tobacco Use   Smoking status: Former    Packs/day: 1.00    Years: 2.00    Total pack years: 2.00    Types: Cigarettes    Quit date: 01/30/1988    Years since quitting: 34.0   Smokeless tobacco: Never  Vaping Use   Vaping Use: Never used  Substance Use Topics   Alcohol use: Yes    Alcohol/week: 5.0 standard drinks of alcohol    Types: 5 Cans of beer per week   Drug use: No      Chart Review Today: I personally reviewed active problem list, medication list, allergies, family history, social history, health maintenance, notes from last encounter, lab results, imaging with the patient/caregiver today.   Review of Systems  Constitutional: Negative.   HENT: Negative.    Eyes: Negative.   Respiratory: Negative.    Cardiovascular: Negative.   Gastrointestinal: Negative.   Endocrine: Negative.   Genitourinary: Negative.   Musculoskeletal: Negative.   Skin: Negative.   Allergic/Immunologic: Negative.   Neurological: Negative.   Hematological: Negative.   Psychiatric/Behavioral: Negative.    All other systems reviewed and are negative.      Objective:   Vitals:  01/23/22 1146  BP: 128/72  Pulse: 98  Resp: 16  SpO2: 98%  Weight: 194 lb (88 kg)  Height: _0  (1.676 m)    Body mass index is 31.31 kg/m.  Physical Exam Vitals and nursing note reviewed.  Constitutional:      General: He is not in acute distress.    Appearance: Normal appearance. He is well-developed. He is not ill-appearing, toxic-appearing or diaphoretic.  HENT:     Head: Normocephalic and atraumatic.     Jaw: No trismus.     Right Ear: Tympanic membrane, ear canal and external ear normal.     Left Ear: Tympanic membrane, ear canal and external ear normal.     Nose: Mucosal edema, congestion and rhinorrhea present. Rhinorrhea is  clear.     Right Turbinates: Enlarged and swollen.     Left Turbinates: Enlarged and swollen.     Right Sinus: Maxillary sinus tenderness present. No frontal sinus tenderness.     Left Sinus: Maxillary sinus tenderness present. No frontal sinus tenderness.     Mouth/Throat:     Mouth: Mucous membranes are moist. Mucous membranes are not pale, not dry and not cyanotic.     Pharynx: Oropharynx is clear. Uvula midline. Posterior oropharyngeal erythema present. No oropharyngeal exudate or uvula swelling.     Tonsils: No tonsillar exudate or tonsillar abscesses.     Comments: Copious postnasal drip visible Eyes:     General: Lids are normal. No scleral icterus.       Right eye: No discharge.        Left eye: No discharge.     Conjunctiva/sclera: Conjunctivae normal.  Neck:     Trachea: Trachea and phonation normal. No tracheal deviation.  Cardiovascular:     Rate and Rhythm: Normal rate and regular rhythm.     Pulses: Normal pulses.          Radial pulses are 2+ on the right side and 2+ on the left side.     Heart sounds: Normal heart sounds. No murmur heard.    No friction rub. No gallop.  Pulmonary:     Effort: Pulmonary effort is normal. No tachypnea, accessory muscle usage or respiratory distress.     Breath sounds: Normal breath sounds. No stridor. No decreased breath sounds, wheezing, rhonchi or rales.  Abdominal:     General: Bowel sounds are normal. There is no distension.     Palpations: Abdomen is soft.     Tenderness: There is no abdominal tenderness.  Musculoskeletal:     Cervical back: Normal range of motion and neck supple.  Skin:    General: Skin is warm and dry.     Capillary Refill: Capillary refill takes less than 2 seconds.     Coloration: Skin is not pale.     Findings: No rash.     Nails: There is no clubbing.  Neurological:     Mental Status: He is alert.     Motor: No abnormal muscle tone.     Coordination: Coordination normal.     Gait: Gait normal.   Psychiatric:        Speech: Speech normal.        Behavior: Behavior normal. Behavior is cooperative.      Results for orders placed or performed in visit on 12/06/20  CBC with Differential/Platelet  Result Value Ref Range   WBC 6.5 3.4 - 10.8 x10E3/uL   RBC 5.35 4.14 - 5.80 x10E6/uL   Hemoglobin 15.8  13.0 - 17.7 g/dL   Hematocrit 46.6 37.5 - 51.0 %   MCV 87 79 - 97 fL   MCH 29.5 26.6 - 33.0 pg   MCHC 33.9 31.5 - 35.7 g/dL   RDW 12.2 11.6 - 15.4 %   Platelets 213 150 - 450 x10E3/uL   Neutrophils 55 Not Estab. %   Lymphs 33 Not Estab. %   Monocytes 9 Not Estab. %   Eos 1 Not Estab. %   Basos 1 Not Estab. %   Neutrophils Absolute 3.6 1.4 - 7.0 x10E3/uL   Lymphocytes Absolute 2.1 0.7 - 3.1 x10E3/uL   Monocytes Absolute 0.6 0.1 - 0.9 x10E3/uL   EOS (ABSOLUTE) 0.1 0.0 - 0.4 x10E3/uL   Basophils Absolute 0.1 0.0 - 0.2 x10E3/uL   Immature Granulocytes 1 Not Estab. %   Immature Grans (Abs) 0.0 0.0 - 0.1 x10E3/uL  Hemoglobin A1c  Result Value Ref Range   Hgb A1c MFr Bld 5.9 (H) 4.8 - 5.6 %   Est. average glucose Bld gHb Est-mCnc 123 mg/dL  Lipid panel  Result Value Ref Range   Cholesterol, Total 220 (H) 100 - 199 mg/dL   Triglycerides 322 (H) 0 - 149 mg/dL   HDL 41 >39 mg/dL   VLDL Cholesterol Cal 57 (H) 5 - 40 mg/dL   LDL Chol Calc (NIH) 122 (H) 0 - 99 mg/dL   Chol/HDL Ratio 5.4 (H) 0.0 - 5.0 ratio  Comprehensive Metabolic Panel (CMET)  Result Value Ref Range   Glucose 88 70 - 99 mg/dL   BUN 18 6 - 24 mg/dL   Creatinine, Ser 1.07 0.76 - 1.27 mg/dL   eGFR 82 >59 mL/min/1.73   BUN/Creatinine Ratio 17 9 - 20   Sodium 140 134 - 144 mmol/L   Potassium 4.7 3.5 - 5.2 mmol/L   Chloride 101 96 - 106 mmol/L   CO2 21 20 - 29 mmol/L   Calcium 9.2 8.7 - 10.2 mg/dL   Total Protein 6.4 6.0 - 8.5 g/dL   Albumin 4.4 3.8 - 4.9 g/dL   Globulin, Total 2.0 1.5 - 4.5 g/dL   Albumin/Globulin Ratio 2.2 1.2 - 2.2   Bilirubin Total 0.5 0.0 - 1.2 mg/dL   Alkaline Phosphatase 112 44 - 121  IU/L   AST 27 0 - 40 IU/L   ALT 43 0 - 44 IU/L       Assessment & Plan:   PT presents with 2-3 weeks of URI sx, sinus congestion/pressure and SOB worsening   ICD-10-CM   1. Upper respiratory tract infection, unspecified type  J06.9 doxycycline (VIBRA-TABS) 100 MG tablet   onset 2-3 weeks ago, with worsening sinus sx, increased pain adn congestion, sinus ttp on exam, tx for acute bacterial sinusitis with doxy    2. Acute bronchitis, unspecified organism  J20.9 predniSONE (DELTASONE) 20 MG tablet    benzonatate (TESSALON) 100 MG capsule    albuterol (VENTOLIN HFA) 108 (90 Base) MCG/ACT inhaler   endorses SOB, lungs w/o rales or rhonchi - likely some bronchitis, previously needed inhalers - will try pred/albuterol and cough meds    3. Gastro-esophageal reflux disease without esophagitis  K21.9 pantoprazole (PROTONIX) 20 MG tablet   with doxy and prednisone - advised pt to protect stomach with PPI BID for the next 1-2 weeks      F/up if not improving in the next 1-2 weeks    Delsa Grana, PA-C 01/23/22 12:21 PM

## 2022-01-23 NOTE — Patient Instructions (Signed)
Start steroids today and take in the morning for the next 5-6 days.  Use the albuterol inhaler every 4-6 hours as needed for wheeze, tight coughing fits, shortness of breath.  The medicine only lasts in your lungs for about 2 hours, so in the first 1-2 days it is okay to use your albuterol inhaler every 2 hours, and then as the steroids begin to work, you should only need it every 4-6 hours or less.  Take Mucinex daily, as directed on box or bottle, and drink ample water.      For worse nasal, sinus or allergy symptoms with seasonal or weather changes you may always try adjusting or starting allergy meds - zyrtec, claritin, allegra, xyzal or a combo of two is very safe. You can adjust or start nasal sprays - steroid intranasal sprays (flonase nasonex, nasocort), try saline nasal spray, or try adding an antihistamine nasal spray.   Depending on your health and vital signs, you may be able to try over-the-counter cold, cough or congestion medications such as Mucinex, Coricidin, Sudafed behind the counter, or even Tylenol or NSAIDs for minor discomfort with sinus pressure or throat irritation.  Allergies and viruses can both cause increased nasal symptoms and sinus pain and congestion, and antibiotics do not treat either of these causes of your symptoms.  Less than 2% of people with symptoms in medical studies had bacterial sinus infection needing antibiotic treatment.    Antibiotics are only indicated for acute bacterial sinusitis which is diagnosed with increased nasal or sinus symptoms that started to improve and then suddenly worsened with severe pain and pressure tenderness on exam and fever, or with prolonged symptoms after conservative management (everything described above) with moderate to severe symptoms which last longer than 10 to 12 days.  There are of course exceptions to this, including some people with immonosuppression who will need antibiotics sooner than every one else, or some people  who have complicated ENT history with prior procedures and recurrent infections.    Allergic Rhinitis, Adult Allergic rhinitis is a reaction to allergens. Allergens are things that can cause an allergic reaction. This condition affects the lining inside the nose (mucous membrane). There are two types of allergic rhinitis: Seasonal. This type is also called hay fever. It happens only during some times of the year. Perennial. This type can happen at any time of the year. This condition cannot be spread from person to person (is not contagious). It can be mild, worse, or very bad. It can develop at any age and may be outgrown. What are the causes? This condition may be caused by: Pollen from grasses, trees, and weeds. Dust mites. Smoke. Mold. Car fumes. The pee (urine), spit, or dander of pets. Dander is dead skin cells from a pet. What increases the risk? You are more likely to develop this condition if: You have allergies in your family. You have problems like allergies in your family. You may have: Swelling of parts of your eyes and eyelids. Asthma. This affects how you breathe. Long-term redness and swelling on your skin. Food allergies. What are the signs or symptoms? The main symptom of this condition is a runny or stuffy nose (nasal congestion). Other symptoms may include: Sneezing or coughing. Itching and tearing of your eyes. Mucus that drips down the back of your throat (postnasal drip). Trouble sleeping. Feeling tired. Headache. Sore throat. How is this treated? There is no cure for this condition. You should avoid things that you are allergic  to. Treatment can help to relieve symptoms. This may include: Medicines that block allergy symptoms, such as corticosteroids or antihistamines. These may be given as a shot, nasal spray, or pill. Avoiding things you are allergic to. Medicines that give you bits of what you are allergic to over time. This is called immunotherapy. It  is done if other treatments do not help. You may get: Shots. Medicine under your tongue. Stronger medicines, if other treatments do not help. Follow these instructions at home: Avoiding allergens Find out what things you are allergic to and avoid them. To do this, try these things: If you get allergies any time of year: Replace carpet with wood, tile, or vinyl flooring. Carpet can trap pet dander and dust. Do not smoke. Do not allow smoking in your home. Change your heating and air conditioning filters at least once a month. If you get allergies only some times of the year: Keep windows closed when you can. Plan things to do outside when pollen counts are lowest. Check pollen counts before you plan things to do outside. When you come indoors, change your clothes and shower before you sit on furniture or bedding. If you are allergic to a pet: Keep the pet out of your bedroom. Vacuum, sweep, and dust often.  General instructions Take over-the-counter and prescription medicines only as told by your doctor. Drink enough fluid to keep your pee (urine) pale yellow. Keep all follow-up visits as told by your doctor. This is important. Where to find more information American Academy of Allergy, Asthma & Immunology: www.aaaai.org Contact a doctor if: You have a fever. You get a cough that does not go away. You make whistling sounds when you breathe (wheeze). Your symptoms slow you down. Your symptoms stop you from doing your normal things each day. Get help right away if: You are short of breath. This symptom may be an emergency. Do not wait to see if the symptom will go away. Get medical help right away. Call your local emergency services (911 in the [place] Do not drive yourself to the hospital. Summary Allergic rhinitis may be treated by taking medicines and avoiding things you are allergic to. If you have allergies only some of the year, keep windows closed when you can at those  times. Contact your doctor if you get a fever or a cough that does not go away. This information is not intended to replace advice given to you by your health care provider. Make sure you discuss any questions you have with your health care provider. Document Revised: 03/09/2019 Document Reviewed: 01/13/2019 Elsevier Patient Education  [place].  Sinus Infection, Adult A sinus infection is soreness and swelling (inflammation) of your sinuses. Sinuses are hollow spaces in the bones around your face. They are located: Around your eyes. In the middle of your forehead. Behind your nose. In your cheekbones. Your sinuses and nasal passages are lined with a fluid called mucus. Mucus drains out of your sinuses. Swelling can trap mucus in your sinuses. This lets germs (bacteria, virus, or fungus) grow, which leads to infection. Most of the time, this condition is caused by a virus. What are the causes? Allergies. Asthma. Germs. Things that block your nose or sinuses. Growths in the nose (nasal polyps). Chemicals or irritants in the air. A fungus. This is rare. What increases the risk? Having a weak body defense system (immune system). Doing a lot of swimming or diving. Using nasal sprays too much. Smoking. What are  the signs or symptoms? The main symptoms of this condition are pain and a feeling of pressure around the sinuses. Other symptoms include: Stuffy nose (congestion). This may make it hard to breathe through your nose. Runny nose (drainage). Soreness, swelling, and warmth in the sinuses. A cough that may get worse at night. Being unable to smell and taste. Mucus that collects in the throat or the back of the nose (postnasal drip). This may cause a sore throat or bad breath. Being very tired (fatigued). A fever. How is this diagnosed? Your symptoms. Your medical history. A physical exam. Tests to find out if your condition is short-term (acute) or long-term (chronic).  Your doctor may: Check your nose for growths (polyps). Check your sinuses using a tool that has a light on one end (endoscope). Check for allergies or germs. Do imaging tests, such as an MRI or CT scan. How is this treated? Treatment for this condition depends on the cause and whether it is short-term or long-term. If caused by a virus, your symptoms should go away on their own within 10 days. You may be given medicines to relieve symptoms. They include: Medicines that shrink swollen tissue in the nose. A spray that treats swelling of the nostrils. Rinses that help get rid of thick mucus in your nose (nasal saline washes). Medicines that treat allergies (antihistamines). Over-the-counter pain relievers. If caused by bacteria, your doctor may wait to see if you will get better without treatment. You may be given antibiotic medicine if you have: A very bad infection. A weak body defense system. If caused by growths in the nose, surgery may be needed. Follow these instructions at home: Medicines Take, use, or apply over-the-counter and prescription medicines only as told by your doctor. These may include nasal sprays. If you were prescribed an antibiotic medicine, take it as told by your doctor. Do not stop taking it even if you start to feel better. Hydrate and humidify  Drink enough water to keep your pee (urine) pale yellow. Use a cool mist humidifier to keep the humidity level in your home above 50%. Breathe in steam for 10-15 minutes, 3-4 times a day, or as told by your doctor. You can do this in the bathroom while a hot shower is running. Try not to spend time in cool or dry air. Rest Rest as much as you can. Sleep with your head raised (elevated). Make sure you get enough sleep each night. General instructions  Put a warm, moist washcloth on your face 3-4 times a day, or as often as told by your doctor. Use nasal saline washes as often as told by your doctor. Wash your hands  often with soap and water. If you cannot use soap and water, use hand sanitizer. Do not smoke. Avoid being around people who are smoking (secondhand smoke). Keep all follow-up visits. Contact a doctor if: You have a fever. Your symptoms get worse. Your symptoms do not get better within 10 days. Get help right away if: You have a very bad headache. You cannot stop vomiting. You have very bad pain or swelling around your face or eyes. You have trouble seeing. You feel confused. Your neck is stiff. You have trouble breathing. These symptoms may be an emergency. Get help right away. Call 911. Do not wait to see if the symptoms will go away. Do not drive yourself to the hospital. Summary A sinus infection is swelling of your sinuses. Sinuses are hollow spaces in the bones  around your face. This condition is caused by tissues in your nose that become inflamed or swollen. This traps germs. These can lead to infection. If you were prescribed an antibiotic medicine, take it as told by your doctor. Do not stop taking it even if you start to feel better. Keep all follow-up visits. This information is not intended to replace advice given to you by your health care provider. Make sure you discuss any questions you have with your health care provider. Document Revised: 12/20/2020 Document Reviewed: 12/20/2020 Elsevier Patient Education  [place].

## 2022-02-19 ENCOUNTER — Other Ambulatory Visit: Payer: Self-pay | Admitting: Family Medicine

## 2022-02-19 DIAGNOSIS — J209 Acute bronchitis, unspecified: Secondary | ICD-10-CM

## 2022-03-07 ENCOUNTER — Other Ambulatory Visit: Payer: Self-pay | Admitting: Family Medicine

## 2022-03-07 DIAGNOSIS — I10 Essential (primary) hypertension: Secondary | ICD-10-CM

## 2022-03-12 ENCOUNTER — Ambulatory Visit: Payer: BLUE CROSS/BLUE SHIELD | Admitting: Family Medicine

## 2022-03-28 ENCOUNTER — Ambulatory Visit: Payer: BLUE CROSS/BLUE SHIELD | Admitting: Physician Assistant

## 2022-03-28 ENCOUNTER — Encounter: Payer: Self-pay | Admitting: Physician Assistant

## 2022-03-28 VITALS — BP 142/80 | HR 90 | Temp 98.1°F | Resp 16 | Ht 66.0 in | Wt 199.2 lb

## 2022-03-28 DIAGNOSIS — R110 Nausea: Secondary | ICD-10-CM

## 2022-03-28 DIAGNOSIS — I10 Essential (primary) hypertension: Secondary | ICD-10-CM | POA: Diagnosis not present

## 2022-03-28 DIAGNOSIS — R0789 Other chest pain: Secondary | ICD-10-CM | POA: Diagnosis not present

## 2022-03-28 DIAGNOSIS — K219 Gastro-esophageal reflux disease without esophagitis: Secondary | ICD-10-CM

## 2022-03-28 MED ORDER — AMLODIPINE BESYLATE 2.5 MG PO TABS: 2.5000 mg | ORAL_TABLET | Freq: Every day | ORAL | 2 refills | Status: DC

## 2022-03-28 NOTE — Patient Instructions (Signed)
Please start taking the Amlodipine 2.5 mg by mouth once per day along with your other medications

## 2022-03-28 NOTE — Progress Notes (Signed)
Acute Office Visit   Patient: Kevin Bernard   DOB: 11/30/1965   57 y.o. Male  MRN: [id number] Visit Date: 03/28/2022  Today's healthcare provider: Dani Gobble Rayetta Veith, PA-C  Introduced myself to the patient as a Journalist, newspaper and provided education on APPs in clinical practice.    Chief Complaint  Patient presents with   Emesis   Heartburn   Gastroesophageal Reflux   Subjective    HPI    GI concerns  Onset: sudden  Duration: since yesterday- reports he is feeling better today  Reports pain sternal pain and upper abdominal pain along with burning and bad taste in throat and mouth  Nausea: yes Vomiting: yes Diarrhea: none  Denies hematemesis  Reflux symptoms: yes reports chest pain and pressure last night and this AM   Interventions:Took Amlodipine which seemed to help symptoms?  He has been taking his Pepcid as directed  He reports this is the first episode he has had like this   He denies recent ingestion of trigger foods prior to symptoms   Reports some pain in arms and along left pectoral and shoulder,  while this was happening along with some perceived palpitations   Medications: Outpatient Medications Prior to Visit  Medication Sig   albuterol (VENTOLIN HFA) 108 (90 Base) MCG/ACT inhaler Inhale 2 puffs into the lungs every 4 (four) hours as needed for wheezing or shortness of breath.   citalopram (CELEXA) 20 MG tablet Take 1 tablet (20 mg total) by mouth daily.   famotidine (PEPCID) 40 MG tablet Take 1 tablet (40 mg total) by mouth daily.   losartan (COZAAR) 50 MG tablet TAKE 1 TABLET BY MOUTH EVERY DAY   meloxicam (MOBIC) 15 MG tablet TAKE 1 TABLET (15 MG TOTAL) BY MOUTH DAILY.   Multiple Vitamin (MULTIVITAMINS PO) Take 1 tablet by mouth daily.   [DISCONTINUED] amLODipine (NORVASC) 2.5 MG tablet Take 1-2 tablets (2.5-5 mg total) by mouth daily.   benzonatate (TESSALON) 100 MG capsule Take 1-2 capsules (100-200 mg total) by mouth 3 (three) times daily as needed for  cough. (Patient not taking: Reported on 03/28/2022)   pantoprazole (PROTONIX) 20 MG tablet Take 1 tablet (20 mg total) by mouth 2 (two) times daily for 14 days. One hour before breakfast   No facility-administered medications prior to visit.    Review of Systems  Constitutional:  Negative for chills.  Respiratory:  Positive for chest tightness and shortness of breath. Negative for wheezing.   Cardiovascular:  Positive for chest pain and palpitations. Negative for leg swelling.  Gastrointestinal:  Positive for abdominal pain, nausea and vomiting. Negative for blood in stool and diarrhea.       Objective    BP (!) 142/80 (BP Location: Right Arm, Patient Position: Sitting, Cuff Size: Normal)   Pulse 90   Temp 98.1 F (36.7 C) (Oral)   Resp 16   Ht '5\' 6"'$  (1.676 m)   Wt 199 lb 3.2 oz (90.4 kg)   SpO2 96%   BMI 32.15 kg/m    Physical Exam Vitals reviewed.  Constitutional:      General: He is awake.     Appearance: Normal appearance. He is well-developed and well-groomed.  HENT:     Head: Normocephalic and atraumatic.  Eyes:     Extraocular Movements: Extraocular movements intact.     Conjunctiva/sclera: Conjunctivae normal.     Pupils: Pupils are equal, round, and reactive to light.  Cardiovascular:  Rate and Rhythm: Normal rate and regular rhythm.     Pulses: Normal pulses.          Radial pulses are 2+ on the right side and 2+ on the left side.     Heart sounds: Normal heart sounds. No murmur heard.    No friction rub. No gallop.  Pulmonary:     Effort: Pulmonary effort is normal.     Breath sounds: Normal breath sounds. No decreased air movement. No decreased breath sounds, wheezing, rhonchi or rales.  Musculoskeletal:        General: Normal range of motion.     Right lower leg: No edema.     Left lower leg: No edema.  Skin:    General: Skin is warm and dry.  Neurological:     General: No focal deficit present.     Mental Status: He is alert and oriented to  person, place, and time. Mental status is at baseline.  Psychiatric:        Mood and Affect: Mood normal.        Behavior: Behavior normal. Behavior is cooperative.        Thought Content: Thought content normal.        Judgment: Judgment normal.     EKG performed today in office EKG demonstrates normal sinus rhythm, rate of ~85 bpm Today's EKG was compared to previous EKG from 05/14/16 and there are no significant changes    No results found for any visits on 03/28/22.  Assessment & Plan      No follow-ups on file.      Problem List Items Addressed This Visit       Cardiovascular and Mediastinum   Hypertension, benign    Chronic, historic condition Reports he has been taking Losartan 50 mg PO QD and Amlodipine 2.5 mg PO PRN when he feels like his BP is elevated He reports several readings of 140s-180s/90s-100s  Recommend he start taking Amlodipine 2.5 mg PO QD instead of PRN at this time. 90 day supply provided and encouraged daily monitoring for BP, especially during first 4 weeks of new regimen Follow up with PCP in about 4-6 weeks to assess response       Relevant Medications   amLODipine (NORVASC) 2.5 MG tablet     Digestive   Gastro-esophageal reflux disease without esophagitis - Primary    Chronic, historic condition, reports recent flare/exacerbation that is resolving today  He reports that he has been taking Pepcid 40 mg PO QD and has had pretty good response with this He states yesterday he started having chest burning and pain along lower portion of sternum and upper abdomen along with some left chest pain and left arm pain  EKG in office today shows normal sinus rhythm without ST segment elevations or T wave inversions Recommend he continue with Pepcid for now and provided information regarding trigger foods to avoid Reviewed using antacids PRN for flares  Follow up as needed for persistent or progressing symptoms       Other Visit Diagnoses     Other  chest pain     Acute, resolved EKG does not show concerning changes  Reviewed recommendations if he has similar chest pain again as this is emergent symptoms- recommend prompt evaluation at either UC or ED for cardiac rule out  Patient voiced understanding and agreement should this happen in the future.    Relevant Orders   EKG 12-Lead   Nausea  No follow-ups on file.   I, Dailen Mcclish E Rayni Nemitz, PA-C, have reviewed all documentation for this visit. The documentation on 03/28/22 for the exam, diagnosis, procedures, and orders are all accurate and complete.   Talitha Givens, MHS, PA-C [facility]

## 2022-03-28 NOTE — Assessment & Plan Note (Signed)
Chronic, historic condition Reports he has been taking Losartan 50 mg PO QD and Amlodipine 2.5 mg PO PRN when he feels like his BP is elevated He reports several readings of 140s-180s/90s-100s  Recommend he start taking Amlodipine 2.5 mg PO QD instead of PRN at this time. 90 day supply provided and encouraged daily monitoring for BP, especially during first 4 weeks of new regimen Follow up with PCP in about 4-6 weeks to assess response

## 2022-03-28 NOTE — Assessment & Plan Note (Signed)
Chronic, historic condition, reports recent flare/exacerbation that is resolving today  He reports that he has been taking Pepcid 40 mg PO QD and has had pretty good response with this He states yesterday he started having chest burning and pain along lower portion of sternum and upper abdomen along with some left chest pain and left arm pain  EKG in office today shows normal sinus rhythm without ST segment elevations or T wave inversions Recommend he continue with Pepcid for now and provided information regarding trigger foods to avoid Reviewed using antacids PRN for flares  Follow up as needed for persistent or progressing symptoms

## 2022-03-29 ENCOUNTER — Other Ambulatory Visit: Payer: Self-pay | Admitting: Family Medicine

## 2022-03-29 DIAGNOSIS — I10 Essential (primary) hypertension: Secondary | ICD-10-CM

## 2022-04-07 ENCOUNTER — Other Ambulatory Visit: Payer: Self-pay | Admitting: Family Medicine

## 2022-04-07 DIAGNOSIS — K219 Gastro-esophageal reflux disease without esophagitis: Secondary | ICD-10-CM

## 2022-04-17 NOTE — Progress Notes (Signed)
Name: Kevin Bernard   MRN: [id number]    DOB: 1965/08/16   Date:04/18/2022       Progress Note  Subjective  Chief Complaint  Annual Exam  HPI  Patient presents for annual CPE.  IPSS Questionnaire (AUA-7): Over the past month.   1)  How often have you had a sensation of not emptying your bladder completely after you finish urinating?  0 - Not at all  2)  How often have you had to urinate again less than two hours after you finished urinating? 0 - Not at all  3)  How often have you found you stopped and started again several times when you urinated?  0 - Not at all  4) How difficult have you found it to postpone urination?  0 - Not at all  5) How often have you had a weak urinary stream?  0 - Not at all  6) How often have you had to push or strain to begin urination?  0 - Not at all  7) How many times did you most typically get up to urinate from the time you went to bed until the time you got up in the morning?  0 - None  Total score:  0-7 mildly symptomatic   8-19 moderately symptomatic   20-35 severely symptomatic     Diet: they cook at home, pack his food, he likes sweets and is trying cut down on desserts and carbohydrates  Exercise: continue regular physical activity  Last Dental Exam: up to date  Last Eye Exam: up to date   Depression: phq 9 is negative    04/18/2022    9:11 AM 03/28/2022    9:54 AM 01/23/2022   11:45 AM 09/06/2021    2:47 PM 01/18/2021    3:18 PM  Depression screen PHQ 2/9  Decreased Interest 0 0 0 0 0  Down, Depressed, Hopeless 0 0 0 0 0  PHQ - 2 Score 0 0 0 0 0  Altered sleeping 3 0 0 3 0  Tired, decreased energy 3 0 0 3 0  Change in appetite 0 0 0 0 0  Feeling bad or failure about yourself  0 0 0 0 0  Trouble concentrating 0 0 0 0 0  Moving slowly or fidgety/restless 0 0 0 0 0  Suicidal thoughts 0 0 0 0 0  PHQ-9 Score 6 0 0 6 0    Hypertension:  BP Readings from Last 3 Encounters:  04/18/22 138/82  03/28/22 (!) 142/80  01/23/22 128/72     Obesity: Wt Readings from Last 3 Encounters:  04/18/22 199 lb (90.3 kg)  03/28/22 199 lb 3.2 oz (90.4 kg)  01/23/22 194 lb (88 kg)   BMI Readings from Last 3 Encounters:  04/18/22 32.12 kg/m  03/28/22 32.15 kg/m  01/23/22 31.31 kg/m     Lipids:  Lab Results  Component Value Date   CHOL 220 (H) 12/26/2020   CHOL 209 (H) 11/24/2019   CHOL 210 (H) 04/14/2018   Lab Results  Component Value Date   HDL 41 12/26/2020   HDL 41 11/24/2019   HDL 39 (L) 04/14/2018   Lab Results  Component Value Date   LDLCALC 122 (H) 12/26/2020   LDLCALC 125 (H) 11/24/2019   LDLCALC 142 (H) 04/14/2018   Lab Results  Component Value Date   TRIG 322 (H) 12/26/2020   TRIG 298 (H) 11/24/2019   TRIG 155 (H) 04/14/2018   Lab Results  Component Value  Date   CHOLHDL 5.4 (H) 12/26/2020   CHOLHDL 5.1 (H) 11/24/2019   CHOLHDL 5.4 (H) 04/14/2018   No results found for: "LDLDIRECT" Glucose:  Glucose  Date Value Ref Range Status  12/26/2020 88 70 - 99 mg/dL Final  11/07/2017 84 65 - 99 mg/dL Final   Glucose, Bld  Date Value Ref Range Status  11/24/2019 79 65 - 99 mg/dL Final    Comment:    .            Fasting reference interval .   04/14/2018 85 65 - 99 mg/dL Final    Comment:    .            Fasting reference interval .   04/09/2017 96 65 - 139 mg/dL Final    Comment:    .        Non-fasting reference interval .     [place] Office Visit from [facility]/18/2022 in [facility]  AUDIT-C Score 2       Married STD testing and prevention (HIV/chl/gon/syphilis): N/A Sexual history: one partner, married, doing well  Hep C Screening: 04/14/18 Skin cancer: Discussed monitoring for atypical lesions - sees Dermatologist yearly - [place] [facility]  Colorectal cancer: 01/18/20 Prostate cancer:   Lab Results  Component Value Date   PSA 1.60 11/24/2019   PSA 3.1 04/14/2018   PSA 1.3 04/09/2017     Lung cancer:  Low Dose CT Chest recommended if  Age 24-80 years, 30 pack-year currently smoking OR have quit w/in 15years. Patient  no a candidate for screening   AAA: The USPSTF recommends one-time screening with ultrasonography in men ages 67 to 26 years who have ever smoked. Patient   no, a candidate for screening  ECG:  03/29/22  Vaccines:   Tdap: up to date Shingrix: up to date Pneumonia: N/A Flu: N/A COVID-19: he had two COVID vaccines   Advanced Care Planning: A voluntary discussion about advance care planning including the explanation and discussion of advance directives.  Discussed health care proxy and Living will, and the patient was able to identify a health care proxy as wife .  Patient does not have a living will and power of attorney of health care   Patient Active Problem List   Diagnosis Date Noted   Hypertension, benign 03/28/2022   Derangement of right knee 01/19/2021   Pre-diabetes 04/16/2018   Panic attack 05/20/2016   Abnormal EKG 05/18/2016   Anxiety about health 05/08/2016   Internal hemorrhoids 12/29/2015   Back pain, chronic 01/18/2015   Gastro-esophageal reflux disease without esophagitis 01/18/2015   Numerous moles 01/18/2015   Hyperglycemia 01/18/2015   History of fracture of femur 01/18/2015    Past Surgical History:  Procedure Laterality Date   APPENDECTOMY     COLONOSCOPY WITH PROPOFOL N/A 01/18/2020   Procedure: COLONOSCOPY WITH PROPOFOL;  Surgeon: Lin Landsman, MD;  Location: [place];  Service: Gastroenterology;  Laterality: N/A;   right femur internal fixiation     VASECTOMY      Family History  Problem Relation Age of Onset   CVA Mother    Cancer Father 62       Pancreatic   Diabetes Father    Depression Brother    Depression Daughter    Epilepsy Brother     Social History   Socioeconomic History   Marital status: Married    Spouse name: Abigail Butts   Number of children: 4   Years of education:  Not on file   Highest education level: Some college, no degree   Occupational History   Occupation: techinal specialist     Employer: [employer]  Tobacco Use   Smoking status: Former    Packs/day: 1.00    Years: 2.00    Additional pack years: 0.00    Total pack years: 2.00    Types: Cigarettes    Quit date: 01/30/1988    Years since quitting: 34.2   Smokeless tobacco: Never  Vaping Use   Vaping Use: Never used  Substance and Sexual Activity   Alcohol use: Yes    Alcohol/week: 5.0 standard drinks of alcohol    Types: 5 Cans of beer per week   Drug use: No   Sexual activity: Yes    Partners: Female    Birth control/protection: None  Other Topics Concern   Not on file  Social History Narrative   Married, they have 4 children.    Social Determinants of Health   Financial Resource Strain: Low Risk  (04/18/2022)   Overall Financial Resource Strain (CARDIA)    Difficulty of Paying Living Expenses: Not hard at all  Food Insecurity: No Food Insecurity (04/18/2022)   Hunger Vital Sign    Worried About Running Out of Food in the Last Year: Never true    Ran Out of Food in the Last Year: Never true  Transportation Needs: No Transportation Needs (04/18/2022)   PRAPARE - Hydrologist (Medical): No    Lack of Transportation (Non-Medical): No  Physical Activity: Sufficiently Active (04/18/2022)   Exercise Vital Sign    Days of Exercise per Week: 7 days    Minutes of Exercise per Session: 30 min  Stress: No Stress Concern Present (04/18/2022)   [place]    Feeling of Stress : Only a little  Social Connections: Moderately Integrated (04/18/2022)   Social Connection and Isolation Panel [NHANES]    Frequency of Communication with Friends and Family: More than three times a week    Frequency of Social Gatherings with Friends and Family: More than three times a week    Attends Religious Services: More than 4 times per year    Active Member of [employer] or  Organizations: No    Attends Archivist Meetings: Never    Marital Status: Married  Human resources officer Violence: Not At Risk (04/18/2022)   Humiliation, Afraid, Rape, and Kick questionnaire    Fear of Current or Ex-Partner: No    Emotionally Abused: No    Physically Abused: No    Sexually Abused: No     Current Outpatient Medications:    albuterol (VENTOLIN HFA) 108 (90 Base) MCG/ACT inhaler, Inhale 2 puffs into the lungs every 4 (four) hours as needed for wheezing or shortness of breath., Disp: 8 g, Rfl: 0   amLODipine (NORVASC) 2.5 MG tablet, Take 1 tablet (2.5 mg total) by mouth daily., Disp: 30 tablet, Rfl: 2   citalopram (CELEXA) 20 MG tablet, Take 1 tablet (20 mg total) by mouth daily., Disp: 90 tablet, Rfl: 1   famotidine (PEPCID) 40 MG tablet, TAKE 1 TABLET BY MOUTH EVERY DAY, Disp: 90 tablet, Rfl: 0   losartan (COZAAR) 50 MG tablet, TAKE 1 TABLET BY MOUTH EVERY DAY, Disp: 30 tablet, Rfl: 0   meloxicam (MOBIC) 15 MG tablet, TAKE 1 TABLET (15 MG TOTAL) BY MOUTH DAILY., Disp: 30 tablet, Rfl: 3   Multiple Vitamin (MULTIVITAMINS PO),  Take 1 tablet by mouth daily., Disp: , Rfl:    pantoprazole (PROTONIX) 20 MG tablet, Take 1 tablet (20 mg total) by mouth 2 (two) times daily for 14 days. One hour before breakfast, Disp: 28 tablet, Rfl: 0  Allergies  Allergen Reactions   Ace Inhibitors Cough     ROS  Constitutional: Negative for fever or weight change.  Respiratory: Negative for cough and shortness of breath.   Cardiovascular: Negative for chest pain or palpitations.  Gastrointestinal: Negative for abdominal pain, no bowel changes.  Musculoskeletal: Negative for gait problem or joint swelling.  Skin: Negative for rash.  Neurological: Negative for dizziness or headache.  No other specific complaints in a complete review of systems (except as listed in HPI above).    Objective  Vitals:   04/18/22 0911  BP: 138/82  Pulse: 72  Resp: 16  SpO2: 97%  Weight: 199 lb  (90.3 kg)  Height: 5\' 6"  (1.676 m)    Body mass index is 32.12 kg/m.  Physical Exam  Constitutional: Patient appears well-developed and well-nourished. No distress.  HENT: Head: Normocephalic and atraumatic. Ears: B TMs ok, no erythema or effusion; Nose: Nose normal. Mouth/Throat: Oropharynx is clear and moist. No oropharyngeal exudate.  Eyes: Conjunctivae and EOM are normal. Pupils are equal, round, and reactive to light. No scleral icterus.  Neck: Normal range of motion. Neck supple. No JVD present. No thyromegaly present.  Cardiovascular: Normal rate, regular rhythm and normal heart sounds.  No murmur heard. No BLE edema. Pulmonary/Chest: Effort normal and breath sounds normal. No respiratory distress. Abdominal: Soft. Bowel sounds are normal, no distension. There is no tenderness. no masses MALE GENITALIA: Normal descended testes bilaterally, no masses palpated, no hernias, no lesions, no discharge RECTAL: Prostate normal size and consistency, no rectal masses or hemorrhoids Musculoskeletal: Normal range of motion, no joint effusions. No gross deformities Neurological: he is alert and oriented to person, place, and time. No cranial nerve deficit. Coordination, balance, strength, speech and gait are normal.  Skin: Skin is warm and dry. No rash noted. No erythema.  Psychiatric: Patient has a normal mood and affect. behavior is normal. Judgment and thought content normal.    Fall Risk:    04/18/2022    9:11 AM 03/28/2022    9:53 AM 01/23/2022   11:45 AM 09/06/2021    2:47 PM 01/18/2021    3:18 PM  Fall Risk   Falls in the past year? 0 0 0 0 0  Number falls in past yr: 0  0 0 0  Injury with Fall? 0  0 0 0  Risk for fall due to : No Fall Risks No Fall Risks No Fall Risks No Fall Risks   Follow up Falls prevention discussed Falls prevention discussed Falls prevention discussed Falls prevention discussed Falls prevention discussed     Functional Status Survey: Is the patient deaf  or have difficulty hearing?: No Does the patient have difficulty seeing, even when wearing glasses/contacts?: No Does the patient have difficulty concentrating, remembering, or making decisions?: No Does the patient have difficulty walking or climbing stairs?: No Does the patient have difficulty dressing or bathing?: No Does the patient have difficulty doing errands alone such as visiting a doctor's office or shopping?: No    Assessment & Plan  1. Well adult exam  - Lipid panel - COMPLETE METABOLIC PANEL WITH GFR - CBC with Differential/Platelet - Hemoglobin A1c  2. Hypertension, benign  - COMPLETE METABOLIC PANEL WITH GFR  3. Pre-diabetes  -  Hemoglobin A1c  4. Long-term use of high-risk medication  - COMPLETE METABOLIC PANEL WITH GFR - CBC with Differential/Platelet  5. Dyslipidemia  - Lipid panel   6. Other chest pain  Patient was seen last month with substernal chest pain associated with fatigue and SOB, EKG unremarkable , discussed referral to cardiologist but he would like to try something easier first, we will check CT cardiac scoring first  - [place]; Future   -Prostate cancer screening and PSA options (with potential risks and benefits of testing vs not testing) were discussed along with recent recs/guidelines. -USPSTF grade A and B recommendations reviewed with patient; age-appropriate recommendations, preventive care, screening tests, etc discussed and encouraged; healthy living encouraged; see AVS for patient education given to patient -Discussed importance of 150 minutes of physical activity weekly, eat two servings of fish weekly, eat one serving of tree nuts ( cashews, pistachios, pecans, almonds.Marland Kitchen) every other day, eat 6 servings of fruit/vegetables daily and drink plenty of water and avoid sweet beverages.  -Reviewed Health Maintenance: yes

## 2022-04-17 NOTE — Patient Instructions (Signed)

## 2022-04-18 ENCOUNTER — Ambulatory Visit (INDEPENDENT_AMBULATORY_CARE_PROVIDER_SITE_OTHER): Payer: BLUE CROSS/BLUE SHIELD | Admitting: Family Medicine

## 2022-04-18 ENCOUNTER — Encounter: Payer: Self-pay | Admitting: Family Medicine

## 2022-04-18 VITALS — BP 138/82 | HR 72 | Resp 16 | Ht 66.0 in | Wt 199.0 lb

## 2022-04-18 DIAGNOSIS — R0789 Other chest pain: Secondary | ICD-10-CM

## 2022-04-18 DIAGNOSIS — R7303 Prediabetes: Secondary | ICD-10-CM

## 2022-04-18 DIAGNOSIS — Z79899 Other long term (current) drug therapy: Secondary | ICD-10-CM | POA: Diagnosis not present

## 2022-04-18 DIAGNOSIS — Z0000 Encounter for general adult medical examination without abnormal findings: Secondary | ICD-10-CM | POA: Diagnosis not present

## 2022-04-18 DIAGNOSIS — I10 Essential (primary) hypertension: Secondary | ICD-10-CM | POA: Diagnosis not present

## 2022-04-18 DIAGNOSIS — E785 Hyperlipidemia, unspecified: Secondary | ICD-10-CM

## 2022-04-26 ENCOUNTER — Other Ambulatory Visit: Payer: Self-pay | Admitting: Family Medicine

## 2022-04-26 DIAGNOSIS — I10 Essential (primary) hypertension: Secondary | ICD-10-CM

## 2022-05-04 ENCOUNTER — Ambulatory Visit
Admission: RE | Admit: 2022-05-04 | Discharge: 2022-05-04 | Disposition: A | Discharge status: Home / Self Care | Payer: Self-pay | Source: Ambulatory Visit | Attending: Family Medicine | Admitting: Family Medicine

## 2022-05-04 DIAGNOSIS — R0789 Other chest pain: Secondary | ICD-10-CM | POA: Insufficient documentation

## 2022-05-28 DIAGNOSIS — Z79899 Other long term (current) drug therapy: Secondary | ICD-10-CM | POA: Diagnosis not present

## 2022-05-28 DIAGNOSIS — I10 Essential (primary) hypertension: Secondary | ICD-10-CM | POA: Diagnosis not present

## 2022-05-28 DIAGNOSIS — R0789 Other chest pain: Secondary | ICD-10-CM | POA: Diagnosis not present

## 2022-05-28 DIAGNOSIS — E785 Hyperlipidemia, unspecified: Secondary | ICD-10-CM | POA: Diagnosis not present

## 2022-05-28 DIAGNOSIS — R7303 Prediabetes: Secondary | ICD-10-CM | POA: Diagnosis not present

## 2022-05-28 DIAGNOSIS — Z0000 Encounter for general adult medical examination without abnormal findings: Secondary | ICD-10-CM | POA: Diagnosis not present

## 2022-06-01 LAB — LIPID PANEL
Chol/HDL Ratio: 5 ratio (ref 0.0–5.0)
Cholesterol, Total: 211 mg/dL — ABNORMAL HIGH (ref 100–199)
HDL: 42 mg/dL (ref >39)
LDL Chol Calc (NIH): 136 mg/dL — ABNORMAL HIGH (ref 0–99)
Triglycerides: 181 mg/dL — ABNORMAL HIGH (ref 0–149)
VLDL Cholesterol Cal: 33 mg/dL (ref 5–40)

## 2022-06-01 LAB — CBC WITH DIFFERENTIAL/PLATELET

## 2022-06-01 LAB — HEMOGLOBIN A1C

## 2022-06-01 LAB — COMPREHENSIVE METABOLIC PANEL
ALT: 40 IU/L (ref 0–44)
AST: 25 IU/L (ref 0–40)
Albumin/Globulin Ratio: 2.1 (ref 1.2–2.2)
Albumin: 4.5 g/dL (ref 3.8–4.9)
Alkaline Phosphatase: 108 IU/L (ref 44–121)
BUN/Creatinine Ratio: 29 — ABNORMAL HIGH (ref 9–20)
BUN: 27 mg/dL — ABNORMAL HIGH (ref 6–24)
Bilirubin Total: 0.4 mg/dL (ref 0.0–1.2)
CO2: 20 mmol/L (ref 20–29)
Calcium: 9 mg/dL (ref 8.7–10.2)
Chloride: 104 mmol/L (ref 96–106)
Creatinine, Ser: 0.92 mg/dL (ref 0.76–1.27)
Globulin, Total: 2.1 g/dL (ref 1.5–4.5)
Glucose: 93 mg/dL (ref 70–99)
Potassium: 4.2 mmol/L (ref 3.5–5.2)
Sodium: 139 mmol/L (ref 134–144)
Total Protein: 6.6 g/dL (ref 6.0–8.5)
eGFR: 98 mL/min/{1.73_m2} (ref >59)

## 2022-06-19 ENCOUNTER — Other Ambulatory Visit: Payer: Self-pay | Admitting: Physician Assistant

## 2022-06-19 DIAGNOSIS — I10 Essential (primary) hypertension: Secondary | ICD-10-CM

## 2022-06-19 NOTE — Telephone Encounter (Signed)
Requested Prescriptions  Pending Prescriptions Disp Refills   amLODipine (NORVASC) 2.5 MG tablet [Pharmacy Med Name: AMLODIPINE BESYLATE 2.5 MG TAB] 90 tablet 0    Sig: TAKE 1 TABLET BY MOUTH EVERY DAY     Cardiovascular: Calcium Channel Blockers 2 Passed - 06/19/2022  2:31 AM      Passed - Last BP in normal range    BP Readings from Last 1 Encounters:  04/18/22 138/82         Passed - Last Heart Rate in normal range    Pulse Readings from Last 1 Encounters:  04/18/22 72         Passed - Valid encounter within last 6 months    Recent Outpatient Visits           2 months ago Well adult exam   [facility] Alba Cory, MD   2 months ago Gastro-esophageal reflux disease without esophagitis   [facility] Mecum, Oswaldo Conroy, PA-C   4 months ago Upper respiratory tract infection, unspecified type   [facility] Danelle Berry, PA-C   9 months ago Hypertension, benign   [facility] Alba Cory, MD   1 year ago Uncontrolled hypertension   [facility] Alba Cory, MD

## 2022-06-20 ENCOUNTER — Ambulatory Visit: Payer: BLUE CROSS/BLUE SHIELD | Admitting: Family Medicine

## 2022-07-21 ENCOUNTER — Other Ambulatory Visit: Payer: Self-pay | Admitting: Podiatry

## 2022-07-30 ENCOUNTER — Other Ambulatory Visit: Payer: Self-pay | Admitting: Family Medicine

## 2022-07-30 DIAGNOSIS — I10 Essential (primary) hypertension: Secondary | ICD-10-CM

## 2022-07-30 DIAGNOSIS — K219 Gastro-esophageal reflux disease without esophagitis: Secondary | ICD-10-CM

## 2022-08-28 ENCOUNTER — Other Ambulatory Visit: Payer: Self-pay | Admitting: Internal Medicine

## 2022-08-28 DIAGNOSIS — I10 Essential (primary) hypertension: Secondary | ICD-10-CM

## 2022-08-28 DIAGNOSIS — K219 Gastro-esophageal reflux disease without esophagitis: Secondary | ICD-10-CM

## 2022-08-29 NOTE — Telephone Encounter (Signed)
Requested Prescriptions  Pending Prescriptions Disp Refills   losartan (COZAAR) 50 MG tablet [Pharmacy Med Name: LOSARTAN POTASSIUM 50 MG TAB] 90 tablet 0    Sig: TAKE 1 TABLET BY MOUTH EVERY DAY     Cardiovascular:  Angiotensin Receptor Blockers Passed - 08/28/2022  1:31 PM      Passed - Cr in normal range and within 180 days    Creat  Date Value Ref Range Status  11/24/2019 1.12 0.70 - 1.33 mg/dL Final    Comment:    For patients >57 years of age, the reference limit for Creatinine is approximately 13% higher for people identified as African-American. .    Creatinine, Ser  Date Value Ref Range Status  05/28/2022 0.92 0.76 - 1.27 mg/dL Final         Passed - K in normal range and within 180 days    Potassium  Date Value Ref Range Status  05/28/2022 4.2 3.5 - 5.2 mmol/L Final         Passed - Patient is not pregnant      Passed - Last BP in normal range    BP Readings from Last 1 Encounters:  04/18/22 138/82         Passed - Valid encounter within last 6 months    Recent Outpatient Visits           4 months ago Well adult exam   [facility] Alba Cory, MD   5 months ago Gastro-esophageal reflux disease without esophagitis   [facility] Mecum, Erin E, PA-C   7 months ago Upper respiratory tract infection, unspecified type   [facility] Danelle Berry, PA-C   11 months ago Hypertension, benign   [facility] Alba Cory, MD   1 year ago Uncontrolled hypertension   [facility], Danna Hefty, MD               famotidine (PEPCID) 40 MG tablet [Pharmacy Med Name: FAMOTIDINE 40 MG TABLET] 90 tablet 0    Sig: TAKE 1 TABLET BY MOUTH EVERY DAY     Gastroenterology:  H2 Antagonists Passed - 08/28/2022  1:31 PM      Passed - Valid encounter within last 12 months    Recent Outpatient Visits           4 months ago Well adult  exam   [facility] Alba Cory, MD   5 months ago Gastro-esophageal reflux disease without esophagitis    [facility] Mecum, Erin E, PA-C   7 months ago Upper respiratory tract infection, unspecified type   [facility] Danelle Berry, PA-C   11 months ago Hypertension, benign   [facility] Alba Cory, MD   1 year ago Uncontrolled hypertension   [facility] Alba Cory, MD

## 2022-09-04 ENCOUNTER — Other Ambulatory Visit: Payer: Self-pay | Admitting: Family Medicine

## 2022-09-04 DIAGNOSIS — F410 Panic disorder [episodic paroxysmal anxiety] without agoraphobia: Secondary | ICD-10-CM

## 2022-09-04 DIAGNOSIS — F411 Generalized anxiety disorder: Secondary | ICD-10-CM

## 2022-09-05 ENCOUNTER — Encounter: Payer: Self-pay | Admitting: Family Medicine

## 2022-09-05 ENCOUNTER — Ambulatory Visit: Payer: BLUE CROSS/BLUE SHIELD | Admitting: Family Medicine

## 2022-09-05 DIAGNOSIS — I10 Essential (primary) hypertension: Secondary | ICD-10-CM

## 2022-09-05 DIAGNOSIS — F410 Panic disorder [episodic paroxysmal anxiety] without agoraphobia: Secondary | ICD-10-CM

## 2022-09-05 DIAGNOSIS — F411 Generalized anxiety disorder: Secondary | ICD-10-CM

## 2022-09-05 DIAGNOSIS — K219 Gastro-esophageal reflux disease without esophagitis: Secondary | ICD-10-CM

## 2022-09-05 MED ORDER — AMLODIPINE BESYLATE 2.5 MG PO TABS: 2.5000 mg | ORAL_TABLET | Freq: Every day | ORAL | 1 refills | Status: DC

## 2022-09-05 MED ORDER — LOSARTAN POTASSIUM 100 MG PO TABS
100.0000 mg | ORAL_TABLET | Freq: Every day | ORAL | 0 refills | Status: DC
Start: 2022-09-05 — End: 2022-12-13

## 2022-09-05 MED ORDER — CITALOPRAM HYDROBROMIDE 20 MG PO TABS
20.0000 mg | ORAL_TABLET | Freq: Every day | ORAL | 1 refills | Status: DC
Start: 2022-09-05 — End: 2023-03-15

## 2022-09-05 MED ORDER — FAMOTIDINE 40 MG PO TABS
40.0000 mg | ORAL_TABLET | Freq: Every day | ORAL | 1 refills | Status: DC
Start: 2022-09-05 — End: 2023-03-15

## 2022-09-05 NOTE — Progress Notes (Signed)
Name: Kevin Bernard   MRN: [id number]    DOB: Sep 27, 1965   Date:09/05/2022       Progress Note  Subjective  Chief Complaint  Follow up  HPI  HTN: he is taking losartan 50 mg and norvasc 2.5 mg but bp is still not at goal, reminded him to be compliant and we will adjust dose to 100 mg daily   He denies chest pain , palpitation or SOB.  Plantar Fascitis: symptoms started around Thanksgiving 2022 , taking meloxicam prn only given by Dr. Clayburn Pert. Doing much better    Dyslipidemia: improved and risk is below 10 % , not on statin therapy   The 10-year ASCVD risk score (Arnett DK, et al., 2019) is: 9.5%   Values used to calculate the score:     Age: 57 years     Sex: Male     Is Non-Hispanic African American: No     Diabetic: No     Tobacco smoker: No     Systolic Blood Pressure: 134 mmHg     Is BP treated: Yes     HDL Cholesterol: 42 mg/dL     Total Cholesterol: 211 mg/dL    GAD: he tried Zoloft but did not control symptoms, responded to Prozac but it caused tremors, since 07/2016 he has been on Citalopram and he has been toleraing it well, no side effects. No panic attacks    GERD: he is now on Famotidine  once a day, no heartburn or indigestion as long as he takes medication once a day,  he still has some symptoms when bending forward for a while. Otherwise doing well   Pre-diabetes: A1C has been elevated, lit went from 5.8 % to 5.7 % and last visit up to 5.9 %. He is being more conscious about his diet   Patient Active Problem List   Diagnosis Date Noted   Hypertension, benign 03/28/2022   Derangement of right knee 01/19/2021   Pre-diabetes 04/16/2018   Panic attack 05/20/2016   Abnormal EKG 05/18/2016   Anxiety about health 05/08/2016   Internal hemorrhoids 12/29/2015   Back pain, chronic 01/18/2015   Gastro-esophageal reflux disease without esophagitis 01/18/2015   Numerous moles 01/18/2015   Hyperglycemia 01/18/2015   History of fracture of femur 01/18/2015    Past  Surgical History:  Procedure Laterality Date   APPENDECTOMY     COLONOSCOPY WITH PROPOFOL N/A 01/18/2020   Procedure: COLONOSCOPY WITH PROPOFOL;  Surgeon: Toney Reil, MD;  Location: [facility] ENDOSCOPY;  Service: Gastroenterology;  Laterality: N/A;   right femur internal fixiation     VASECTOMY      Family History  Problem Relation Age of Onset   CVA Mother    Cancer Father 51       Pancreatic   Diabetes Father    Depression Brother    Depression Daughter    Epilepsy Brother     Social History   Tobacco Use   Smoking status: Former    Current packs/day: 0.00    Average packs/day: 1 pack/day for 2.0 years (2.0 ttl pk-yrs)    Types: Cigarettes    Start date: 01/29/1986    Quit date: 01/30/1988    Years since quitting: 34.6   Smokeless tobacco: Never  Substance Use Topics   Alcohol use: Yes    Alcohol/week: 5.0 standard drinks of alcohol    Types: 5 Cans of beer per week     Current Outpatient Medications:    albuterol (VENTOLIN  HFA) 108 (90 Base) MCG/ACT inhaler, Inhale 2 puffs into the lungs every 4 (four) hours as needed for wheezing or shortness of breath., Disp: 8 g, Rfl: 0   amLODipine (NORVASC) 2.5 MG tablet, TAKE 1 TABLET BY MOUTH EVERY DAY, Disp: 90 tablet, Rfl: 0   citalopram (CELEXA) 20 MG tablet, TAKE 1 TABLET BY MOUTH EVERY DAY, Disp: 30 tablet, Rfl: 0   famotidine (PEPCID) 40 MG tablet, TAKE 1 TABLET BY MOUTH EVERY DAY, Disp: 90 tablet, Rfl: 0   losartan (COZAAR) 50 MG tablet, TAKE 1 TABLET BY MOUTH EVERY DAY, Disp: 90 tablet, Rfl: 0   meloxicam (MOBIC) 15 MG tablet, TAKE 1 TABLET (15 MG TOTAL) BY MOUTH DAILY., Disp: 30 tablet, Rfl: 3   Multiple Vitamin (MULTIVITAMINS PO), Take 1 tablet by mouth daily., Disp: , Rfl:    pantoprazole (PROTONIX) 20 MG tablet, Take 1 tablet (20 mg total) by mouth 2 (two) times daily for 14 days. One hour before breakfast, Disp: 28 tablet, Rfl: 0  Allergies  Allergen Reactions   Ace Inhibitors Cough    I personally reviewed  active problem list, medication list, allergies, family history with the patient/caregiver today.   ROS  Constitutional: Negative for fever or weight change.  Respiratory: Negative for cough and shortness of breath.   Cardiovascular: Negative for chest pain or palpitations.  Gastrointestinal: Negative for abdominal pain, no bowel changes.  Musculoskeletal: Negative for gait problem or joint swelling.  Skin: Negative for rash.  Neurological: Negative for dizziness or headache.  No other specific complaints in a complete review of systems (except as listed in HPI above).   Objective  Vitals:   09/05/22 1315  BP: 134/82  Pulse: 70  Resp: 16  Temp: 98.1 F (36.7 C)  TempSrc: Oral  Weight: 198 lb 9.6 oz (90.1 kg)  Height: 5\' 6"  (1.676 m)    Body mass index is 32.05 kg/m.  Physical Exam  Constitutional: Patient appears well-developed and well-nourished. Obese  No distress.  HEENT: head atraumatic, normocephalic, pupils equal and reactive to light, neck supple Cardiovascular: Normal rate, regular rhythm and normal heart sounds.  No murmur heard. No BLE edema. Pulmonary/Chest: Effort normal and breath sounds normal. No respiratory distress. Abdominal: Soft.  There is no tenderness. Psychiatric: Patient has a normal mood and affect. behavior is normal. Judgment and thought content normal.     PHQ2/9:    09/05/2022    1:14 PM 04/18/2022    9:11 AM 03/28/2022    9:54 AM 01/23/2022   11:45 AM 09/06/2021    2:47 PM  Depression screen PHQ 2/9  Decreased Interest 0 0 0 0 0  Down, Depressed, Hopeless 0 0 0 0 0  PHQ - 2 Score 0 0 0 0 0  Altered sleeping 0 3 0 0 3  Tired, decreased energy 0 3 0 0 3  Change in appetite 0 0 0 0 0  Feeling bad or failure about yourself  0 0 0 0 0  Trouble concentrating 0 0 0 0 0  Moving slowly or fidgety/restless 0 0 0 0 0  Suicidal thoughts 0 0 0 0 0  PHQ-9 Score 0 6 0 0 6    phq 9 is negative   Fall Risk:    09/05/2022    1:14 PM 04/18/2022     9:11 AM 03/28/2022    9:53 AM 01/23/2022   11:45 AM 09/06/2021    2:47 PM  Fall Risk   Falls in the past year?  0 0 0 0 0  Number falls in past yr:  0  0 0  Injury with Fall?  0  0 0  Risk for fall due to : No Fall Risks No Fall Risks No Fall Risks No Fall Risks No Fall Risks  Follow up Falls prevention discussed Falls prevention discussed Falls prevention discussed Falls prevention discussed Falls prevention discussed      Functional Status Survey: Is the patient deaf or have difficulty hearing?: No Does the patient have difficulty seeing, even when wearing glasses/contacts?: No Does the patient have difficulty concentrating, remembering, or making decisions?: No Does the patient have difficulty walking or climbing stairs?: No Does the patient have difficulty dressing or bathing?: No Does the patient have difficulty doing errands alone such as visiting a doctor's office or shopping?: No    Assessment & Plan  1. Gastro-esophageal reflux disease without esophagitis  - famotidine (PEPCID) 40 MG tablet; Take 1 tablet (40 mg total) by mouth daily.  Dispense: 90 tablet; Refill: 1  2. GAD (generalized anxiety disorder)  - citalopram (CELEXA) 20 MG tablet; Take 1 tablet (20 mg total) by mouth daily.  Dispense: 90 tablet; Refill: 1  3. Panic attack  - citalopram (CELEXA) 20 MG tablet; Take 1 tablet (20 mg total) by mouth daily.  Dispense: 90 tablet; Refill: 1  4. Hypertension, benign  - amLODipine (NORVASC) 2.5 MG tablet; Take 1 tablet (2.5 mg total) by mouth daily.  Dispense: 90 tablet; Refill: 1 - losartan (COZAAR) 100 MG tablet; Take 1 tablet (100 mg total) by mouth daily.  Dispense: 90 tablet; Refill: 0

## 2022-09-05 NOTE — Telephone Encounter (Signed)
Pt is scheduled for 1:20pm today.

## 2022-09-13 ENCOUNTER — Ambulatory Visit: Payer: BLUE CROSS/BLUE SHIELD

## 2022-09-13 VITALS — BP 134/74

## 2022-09-13 DIAGNOSIS — I10 Essential (primary) hypertension: Secondary | ICD-10-CM

## 2022-09-13 NOTE — Progress Notes (Signed)
Patient here for blood pressure check.  Per Dr. Carlynn Purl at last visit:   HTN: he is taking losartan 50 mg and norvasc 2.5 mg but bp is still not at goal, reminded him to be compliant and we will adjust dose to 100 mg daily   He denies chest pain , palpitation or SOB.   Blood pressure today is:134/74 Bp med just started on sunday

## 2022-12-13 ENCOUNTER — Other Ambulatory Visit: Payer: Self-pay | Admitting: Family Medicine

## 2022-12-13 DIAGNOSIS — I10 Essential (primary) hypertension: Secondary | ICD-10-CM

## 2023-01-03 NOTE — Progress Notes (Unsigned)
Name: Kevin Bernard   MRN: [id number]    DOB: [birth year]   Date:01/03/2023       Progress Note  Subjective  Chief Complaint  No chief complaint on file.   HPI  *** Patient Active Problem List   Diagnosis Date Noted   Hypertension, benign 03/28/2022   Derangement of right knee 01/19/2021   Pre-diabetes 04/16/2018   Panic attack 05/20/2016   Abnormal EKG 05/18/2016   Anxiety about health 05/08/2016   Internal hemorrhoids 12/29/2015   Back pain, chronic 01/18/2015   Gastro-esophageal reflux disease without esophagitis 01/18/2015   Numerous moles 01/18/2015   Hyperglycemia 01/18/2015   History of fracture of femur 01/18/2015    Past Surgical History:  Procedure Laterality Date   APPENDECTOMY     COLONOSCOPY WITH PROPOFOL N/A 01/18/2020   Procedure: COLONOSCOPY WITH PROPOFOL;  Surgeon: Toney Reil, MD;  [facility] END[facility];  Service: Gastroenterology;  Laterality: N/A;   right femur internal fixiation     VASECTOMY      Family History  Problem Relation Age of Onset   CVA Mother    Cancer Father 91       Pancreatic   Diabetes Father    Depression Brother    Depression Daughter    Epilepsy Brother     Social History   Tobacco Use   Smoking status: Former    Current packs/day: 0.00    Average packs/day: 1 pack/day for 2.0 years (2.0 ttl pk-yrs)    Types: Cigarettes    Start date: 01/29/1986    Quit date: 01/30/1988    Years since quitting: 34.9   Smokeless tobacco: Never  Substance Use Topics   Alcohol use: Yes    Alcohol/week: 5.0 standard drinks of alcohol    Types: 5 Cans of beer per week     Current Outpatient Medications:    albuterol (VENTOLIN HFA) 108 (90 Base) MCG/ACT inhaler, Inhale 2 puffs into the lungs every 4 (four) hours as needed for wheezing or shortness of breath., Disp: 8 g, Rfl: 0   amLODipine (NORVASC) 2.5 MG tablet, Take 1 tablet (2.5 mg total) by mouth daily., Disp: 90 tablet, Rfl: 1   citalopram (CELEXA) 20 MG tablet, Take 1  tablet (20 mg total) by mouth daily., Disp: 90 tablet, Rfl: 1   famotidine (PEPCID) 40 MG tablet, Take 1 tablet (40 mg total) by mouth daily., Disp: 90 tablet, Rfl: 1   losartan (COZAAR) 100 MG tablet, TAKE 1 TABLET BY MOUTH EVERY DAY, Disp: 90 tablet, Rfl: 0   meloxicam (MOBIC) 15 MG tablet, TAKE 1 TABLET (15 MG TOTAL) BY MOUTH DAILY., Disp: 30 tablet, Rfl: 3   Multiple Vitamin (MULTIVITAMINS PO), Take 1 tablet by mouth daily., Disp: , Rfl:   Allergies  Allergen Reactions   Ace Inhibitors Cough    I personally reviewed {Reviewed:[id number]} with the patient/caregiver today.   ROS  ***  Objective  There were no vitals filed for this visit.  There is no height or weight on file to calculate BMI.  Physical Exam ***  No results found for this or any previous visit (from the past 2160 hour(s)).  Diabetic Foot Exam: Diabetic Foot Exam - Simple   No data filed    ***  PHQ2/9:    09/05/2022    1:14 PM 04/18/2022    9:11 AM 03/28/2022    9:54 AM 01/23/2022   11:45 AM 09/06/2021    2:47 PM  Depression screen PHQ 2/9  Decreased  Interest 0 0 0 0 0  Down, Depressed, Hopeless 0 0 0 0 0  PHQ - 2 Score 0 0 0 0 0  Altered sleeping 0 3 0 0 3  Tired, decreased energy 0 3 0 0 3  Change in appetite 0 0 0 0 0  Feeling bad or failure about yourself  0 0 0 0 0  Trouble concentrating 0 0 0 0 0  Moving slowly or fidgety/restless 0 0 0 0 0  Suicidal thoughts 0 0 0 0 0  PHQ-9 Score 0 6 0 0 6    phq 9 is {gen pos [id number]} ***  Fall Risk:    09/05/2022    1:14 PM 04/18/2022    9:11 AM 03/28/2022    9:53 AM 01/23/2022   11:45 AM 09/06/2021    2:47 PM  Fall Risk   Falls in the past year? 0 0 0 0 0  Number falls in past yr:  0  0 0  Injury with Fall?  0  0 0  Risk for fall due to : No Fall Risks No Fall Risks No Fall Risks No Fall Risks No Fall Risks  Follow up Falls prevention discussed Falls prevention discussed Falls prevention discussed Falls prevention discussed Falls prevention  discussed   ***   Functional Status Survey:   ***   Assessment & Plan  *** There are no diagnoses linked to this encounter.

## 2023-01-11 ENCOUNTER — Ambulatory Visit: Payer: BLUE CROSS/BLUE SHIELD | Admitting: Family Medicine

## 2023-01-11 DIAGNOSIS — Z23 Encounter for immunization: Secondary | ICD-10-CM

## 2023-01-20 ENCOUNTER — Other Ambulatory Visit: Payer: Self-pay | Admitting: Podiatry

## 2023-01-21 NOTE — Telephone Encounter (Signed)
Warning came up when trying to refill medication

## 2023-03-08 ENCOUNTER — Encounter: Payer: Self-pay | Admitting: Family Medicine

## 2023-03-13 ENCOUNTER — Ambulatory Visit: Payer: BLUE CROSS/BLUE SHIELD | Admitting: Family Medicine

## 2023-03-15 ENCOUNTER — Ambulatory Visit: Payer: BLUE CROSS/BLUE SHIELD | Admitting: Family Medicine

## 2023-03-15 ENCOUNTER — Encounter: Payer: Self-pay | Admitting: Family Medicine

## 2023-03-15 VITALS — BP 164/102 | HR 83 | Resp 16 | Ht 66.0 in | Wt 209.5 lb

## 2023-03-15 DIAGNOSIS — I10 Essential (primary) hypertension: Secondary | ICD-10-CM

## 2023-03-15 DIAGNOSIS — K219 Gastro-esophageal reflux disease without esophagitis: Secondary | ICD-10-CM

## 2023-03-15 DIAGNOSIS — F411 Generalized anxiety disorder: Secondary | ICD-10-CM

## 2023-03-15 DIAGNOSIS — R0683 Snoring: Secondary | ICD-10-CM

## 2023-03-15 DIAGNOSIS — E785 Hyperlipidemia, unspecified: Secondary | ICD-10-CM

## 2023-03-15 DIAGNOSIS — M1711 Unilateral primary osteoarthritis, right knee: Secondary | ICD-10-CM

## 2023-03-15 DIAGNOSIS — F410 Panic disorder [episodic paroxysmal anxiety] without agoraphobia: Secondary | ICD-10-CM

## 2023-03-15 MED ORDER — AMLODIPINE-OLMESARTAN 5-40 MG PO TABS
1.0000 | ORAL_TABLET | Freq: Every day | ORAL | 0 refills | Status: DC
Start: 2023-03-15 — End: 2023-04-29

## 2023-03-15 MED ORDER — FAMOTIDINE 40 MG PO TABS
40.0000 mg | ORAL_TABLET | Freq: Every day | ORAL | 1 refills | Status: DC
Start: 2023-03-15 — End: 2023-10-17

## 2023-03-15 MED ORDER — CITALOPRAM HYDROBROMIDE 20 MG PO TABS: 20.0000 mg | ORAL_TABLET | Freq: Every day | ORAL | 1 refills | Status: DC

## 2023-03-15 NOTE — Progress Notes (Signed)
 Name: Kevin Bernard   MRN: [id number]    DOB: [birth year]   Date:03/15/2023       Progress Note  Subjective  Chief Complaint  Chief Complaint  Patient presents with   Medical Management of Chronic Issues    HTN- readings are high, pt states feels chest fluttery and started to notice his tongue comes out of nowhere even by talking not sure if related to bp being high      Discussed the use of AI scribe software for clinical note transcription with the patient, who gave verbal consent to proceed.  History of Present Illness   Kevin Bernard is a 58 year old male with hypertension who presents with elevated blood pressure and jaw/tongue movements.  He has experienced elevated blood pressure over the past month, with readings reaching as high as 160/98 mmHg. He monitors his blood pressure at work, where a doctor is available. In August, his blood pressure was recorded at 140/36 mmHg and later normalized to 134/74 mmHg. He is currently taking amlodipine 2.5 mg and losartan 100 mg and has not been skipping doses  He describes new involuntary jaw and tongue movements that began about a month ago. These movements occur both during the day and at night, causing him to bite his tongue and alter his normal bite.  He reports increased snoring and interrupted sleep, which he attributes to possible teeth grinding at night. He has not been evaluated for sleep apnea and does not use a CPAP machine.  He mentions experiencing stress due to financial issues, which have been ongoing for a couple of years. He has a history of panic attacks but reports only a minor episode last night, which he attributes to anxiety over his elevated blood pressure. He is currently taking citalopram for anxiety and panic attacks.  He occasionally takes meloxicam for knee pain. He also takes famotidine for heartburn and indigestion, which are well-controlled. No recent weight gain, and his weight is stable at 198 pounds.          Patient Active Problem List   Diagnosis Date Noted   Hypertension, benign 03/28/2022   Derangement of right knee 01/19/2021   Pre-diabetes 04/16/2018   Panic attack 05/20/2016   Abnormal EKG 05/18/2016   Anxiety about health 05/08/2016   Internal hemorrhoids 12/29/2015   Back pain, chronic 01/18/2015   Gastro-esophageal reflux disease without esophagitis 01/18/2015   Numerous moles 01/18/2015   Hyperglycemia 01/18/2015   History of fracture of femur 01/18/2015    Past Surgical History:  Procedure Laterality Date   APPENDECTOMY     COLONOSCOPY WITH PROPOFOL N/A 01/18/2020   Procedure: COLONOSCOPY WITH PROPOFOL;  Surgeon: Toney Reil, MD;  Location: [facility]COPY;  Service: Gastroenterology;  Laterality: N/A;   right femur internal fixiation     VASECTOMY      Family History  Problem Relation Age of Onset   CVA Mother    Cancer Father 4       Pancreatic   Diabetes Father    Depression Brother    Depression Daughter    Epilepsy Brother     Social History   Tobacco Use   Smoking status: Former    Current packs/day: 0.00    Average packs/day: 1 pack/day for 2.0 years (2.0 ttl pk-yrs)    Types: Cigarettes    Start date: 01/29/1986    Quit date: 01/30/1988    Years since quitting: 35.1   Smokeless tobacco: Never  Substance Use  Topics   Alcohol use: Yes    Alcohol/week: 5.0 standard drinks of alcohol    Types: 5 Cans of beer per week     Current Outpatient Medications:    albuterol (VENTOLIN HFA) 108 (90 Base) MCG/ACT inhaler, Inhale 2 puffs into the lungs every 4 (four) hours as needed for wheezing or shortness of breath., Disp: 8 g, Rfl: 0   amLODipine-olmesartan (AZOR) 5-40 MG tablet, Take 1 tablet by mouth daily., Disp: 30 tablet, Rfl: 0   Multiple Vitamin (MULTIVITAMINS PO), Take 1 tablet by mouth daily., Disp: , Rfl:    citalopram (CELEXA) 20 MG tablet, Take 1 tablet (20 mg total) by mouth daily., Disp: 90 tablet, Rfl: 1   famotidine (PEPCID) 40 MG  tablet, Take 1 tablet (40 mg total) by mouth daily., Disp: 90 tablet, Rfl: 1  Allergies  Allergen Reactions   Ace Inhibitors Cough    I personally reviewed active problem list, medication list, allergies, family history with the patient/caregiver today.   ROS  Ten systems reviewed and is negative except as mentioned in HPI    Objective  Vitals:   03/15/23 1444  BP: (!) 168/104  Pulse: 83  Resp: 16  SpO2: 96%  Weight: 209 lb 8 oz (95 kg)  Height: 5\' 6"  (1.676 m)    Body mass index is 33.81 kg/m.  Physical Exam  Constitutional: Patient appears well-developed and well-nourished. Obese  No distress.  HEENT: head atraumatic, normocephalic, pupils equal and reactive to light, neck supple Cardiovascular: Normal rate, regular rhythm and normal heart sounds.  No murmur heard. No BLE edema. Pulmonary/Chest: Effort normal and breath sounds normal. No respiratory distress. Abdominal: Soft.  There is no tenderness. Psychiatric: Patient has a normal mood and affect. behavior is normal. Judgment and thought content normal.   Diabetic Foot Exam:     PHQ2/9:    03/15/2023    2:38 PM 09/05/2022    1:14 PM 04/18/2022    9:11 AM 03/28/2022    9:54 AM 01/23/2022   11:45 AM  Depression screen PHQ 2/9  Decreased Interest 0 0 0 0 0  Down, Depressed, Hopeless 0 0 0 0 0  PHQ - 2 Score 0 0 0 0 0  Altered sleeping 0 0 3 0 0  Tired, decreased energy 0 0 3 0 0  Change in appetite 0 0 0 0 0  Feeling bad or failure about yourself  0 0 0 0 0  Trouble concentrating 0 0 0 0 0  Moving slowly or fidgety/restless 0 0 0 0 0  Suicidal thoughts 0 0 0 0 0  PHQ-9 Score 0 0 6 0 0  Difficult doing work/chores Not difficult at all        phq 9 is negative  Fall Risk:    03/15/2023    2:38 PM 09/05/2022    1:14 PM 04/18/2022    9:11 AM 03/28/2022    9:53 AM 01/23/2022   11:45 AM  Fall Risk   Falls in the past year? 0 0 0 0 0  Number falls in past yr: 0  0  0  Injury with Fall? 0  0  0  Risk for  fall due to : No Fall Risks No Fall Risks No Fall Risks No Fall Risks No Fall Risks  Follow up Falls prevention discussed;Education provided;Falls evaluation completed Falls prevention discussed Falls prevention discussed Falls prevention discussed Falls prevention discussed     Assessment and Plan    Hypertension Uncontrolled  hypertension over the past month despite adherence to current regimen (Losartan 100mg  and Amlodipine 2.5mg ). Recent BP reading of 160/98 at work. No significant changes in lifestyle or stressors identified. -Change medication to Olmesartan/Amlodipine combination pill (5/40mg ). -Check BP in 1 week with nurse to assess response to new medication. -Order blood work to check kidney, liver function, CBC, and thyroid. -We did a cardiac calcium score last year -  19.8, 56th percentile for age and sex (April 2024) - discussed referral to cardiologist but he would like to hold off due to cost  Sleep Apnea Increased snoring and interrupted sleep reported. High Epworth Sleepiness Scale score of 20 suggestive of significant sleep disorder. -Order sleep study. -Consider oral device for potential teeth grinding if sleep apnea confirmed.  Osteoarthritis Reports occasional use of Meloxicam for knee pain. -Discontinue Meloxicam due to potential for increasing blood pressure. -Recommend combination of Aleve one tablet otc max  and Tylenol 500mg  for pain control as needed.  Generalized Anxiety Disorder/Panic Attacks Currently managed with Citalopram. Recent minor episode potentially related to concern over high blood pressure. -Continue Citalopram.  Hyperlipidemia Patient prefers to manage without medication. -Monitor cholesterol levels.  Gastroesophageal Reflux Disease Managed with Famotidine. -Continue Famotidine.  Oral Health Recent onset of abnormal jaw movement and tongue protrusion, potentially related to teeth grinding. -Recommend dental consultation for potential  guard or other intervention.  Follow-up in 1 month to reassess blood pressure control, sleep study results, and overall health status.

## 2023-03-16 LAB — CBC WITH DIFFERENTIAL/PLATELET
Basophils Absolute: 0.1 10*3/uL (ref 0.0–0.2)
Basos: 1 %
EOS (ABSOLUTE): 0.1 10*3/uL (ref 0.0–0.4)
Eos: 2 %
Hematocrit: 47.2 % (ref 37.5–51.0)
Hemoglobin: 15.5 g/dL (ref 13.0–17.7)
Immature Grans (Abs): 0 10*3/uL (ref 0.0–0.1)
Immature Granulocytes: 1 %
Lymphocytes Absolute: 2 10*3/uL (ref 0.7–3.1)
Lymphs: 31 %
MCH: 29.8 pg (ref 26.6–33.0)
MCHC: 32.8 g/dL (ref 31.5–35.7)
MCV: 91 fL (ref 79–97)
Monocytes Absolute: 0.6 10*3/uL (ref 0.1–0.9)
Monocytes: 9 %
Neutrophils Absolute: 3.6 10*3/uL (ref 1.4–7.0)
Neutrophils: 56 %
Platelets: 217 10*3/uL (ref 150–450)
RBC: 5.2 x10E6/uL (ref 4.14–5.80)
RDW: 11.9 % (ref 11.6–15.4)
WBC: 6.3 10*3/uL (ref 3.4–10.8)

## 2023-03-16 LAB — COMPREHENSIVE METABOLIC PANEL
ALT: 28 [IU]/L (ref 0–44)
AST: 21 [IU]/L (ref 0–40)
Albumin: 4.6 g/dL (ref 3.8–4.9)
Alkaline Phosphatase: 104 [IU]/L (ref 44–121)
BUN/Creatinine Ratio: 20 (ref 9–20)
BUN: 18 mg/dL (ref 6–24)
Bilirubin Total: 0.4 mg/dL (ref 0.0–1.2)
CO2: 24 mmol/L (ref 20–29)
Calcium: 9.8 mg/dL (ref 8.7–10.2)
Chloride: 103 mmol/L (ref 96–106)
Creatinine, Ser: 0.91 mg/dL (ref 0.76–1.27)
Globulin, Total: 2 g/dL (ref 1.5–4.5)
Glucose: 84 mg/dL (ref 70–99)
Potassium: 4.3 mmol/L (ref 3.5–5.2)
Sodium: 142 mmol/L (ref 134–144)
Total Protein: 6.6 g/dL (ref 6.0–8.5)
eGFR: 98 mL/min/{1.73_m2} (ref >59)

## 2023-03-16 LAB — TSH: TSH: 3.97 u[IU]/mL (ref 0.450–4.500)

## 2023-03-18 ENCOUNTER — Encounter: Payer: Self-pay | Admitting: Family Medicine

## 2023-03-29 ENCOUNTER — Ambulatory Visit: Payer: BLUE CROSS/BLUE SHIELD

## 2023-03-29 DIAGNOSIS — I10 Essential (primary) hypertension: Secondary | ICD-10-CM

## 2023-03-29 NOTE — Progress Notes (Signed)
 Patient is in office today for a nurse visit for Blood Pressure Check. Patient blood pressure was 162/92, Patient No chest pain, No shortness of breath, No dyspnea on exertion, No orthopnea, No paroxysmal nocturnal dyspnea, No edema, No palpitations, No syncope. Hypertension Uncontrolled hypertension over the past month despite adherence to current regimen (Losartan 100mg  and Amlodipine 2.5mg ). Recent BP reading of 160/98 at work. No significant changes in lifestyle or stressors identified. -Change medication to Olmesartan/Amlodipine combination pill (5/40mg ). -Check BP in 1 week with nurse to assess response to new medication.   Spoke w/ Dr. Carlynn Purl patient is going to continue current dose and add an amlodopine 5mg  to it to make Olmesartan/Amlodipine combination 10/40mg  and recheck nurse visit in 1 week.  Pt notified

## 2023-04-05 ENCOUNTER — Ambulatory Visit

## 2023-04-05 VITALS — BP 128/76

## 2023-04-05 DIAGNOSIS — Z0130 Encounter for examination of blood pressure without abnormal findings: Secondary | ICD-10-CM

## 2023-04-05 NOTE — Progress Notes (Signed)
 Patient is in office today for a nurse visit for Blood Pressure Check. Patient blood pressure was 128/76, Patient No chest pain, No shortness of breath, No dyspnea on exertion

## 2023-04-06 ENCOUNTER — Other Ambulatory Visit: Payer: Self-pay | Admitting: Family Medicine

## 2023-04-06 DIAGNOSIS — I10 Essential (primary) hypertension: Secondary | ICD-10-CM

## 2023-04-08 ENCOUNTER — Other Ambulatory Visit: Payer: Self-pay | Admitting: Family Medicine

## 2023-04-08 MED ORDER — AMLODIPINE BESYLATE 2.5 MG PO TABS: 2.5000 mg | ORAL_TABLET | Freq: Every day | ORAL | 0 refills | Status: DC

## 2023-04-15 ENCOUNTER — Other Ambulatory Visit: Payer: Self-pay | Admitting: Family Medicine

## 2023-04-15 DIAGNOSIS — I10 Essential (primary) hypertension: Secondary | ICD-10-CM

## 2023-04-29 ENCOUNTER — Other Ambulatory Visit: Payer: Self-pay | Admitting: Family Medicine

## 2023-04-29 DIAGNOSIS — I10 Essential (primary) hypertension: Secondary | ICD-10-CM

## 2023-04-29 MED ORDER — AMLODIPINE-OLMESARTAN 10-40 MG PO TABS: 1.0000 | ORAL_TABLET | Freq: Every day | ORAL | 0 refills | Status: DC

## 2023-05-03 ENCOUNTER — Ambulatory Visit: Payer: BLUE CROSS/BLUE SHIELD | Admitting: Family Medicine

## 2023-05-03 ENCOUNTER — Encounter: Payer: Self-pay | Admitting: Family Medicine

## 2023-05-03 VITALS — BP 122/82 | HR 79 | Resp 16 | Ht 66.0 in | Wt 207.9 lb

## 2023-05-03 DIAGNOSIS — E785 Hyperlipidemia, unspecified: Secondary | ICD-10-CM

## 2023-05-03 DIAGNOSIS — R7303 Prediabetes: Secondary | ICD-10-CM

## 2023-05-03 DIAGNOSIS — Z23 Encounter for immunization: Secondary | ICD-10-CM

## 2023-05-03 DIAGNOSIS — Z0000 Encounter for general adult medical examination without abnormal findings: Secondary | ICD-10-CM

## 2023-05-03 DIAGNOSIS — Z0001 Encounter for general adult medical examination with abnormal findings: Secondary | ICD-10-CM | POA: Diagnosis not present

## 2023-05-03 NOTE — Progress Notes (Signed)
 Name: Kevin Bernard   MRN: [id number]    DOB: 1965-02-16   Date:05/03/2023       Progress Note  Subjective  Chief Complaint  Chief Complaint  Patient presents with   Annual Exam    HPI  Patient presents for annual CPE .   IPSS     Row Name 05/03/23 1408         International Prostate Symptom Score   How often have you had the sensation of not emptying your bladder? Not at All     How often have you had to urinate less than every two hours? Not at All     How often have you found you stopped and started again several times when you urinated? Not at All     How often have you found it difficult to postpone urination? Not at All     How often have you had a weak urinary stream? Not at All     How often have you had to strain to start urination? Not at All     How many times did you typically get up at night to urinate? 1 Time     Total IPSS Score 1       Quality of Life due to urinary symptoms   If you were to spend the rest of your life with your urinary condition just the way it is now how would you feel about that? Pleased              Diet: they cook at home Exercise: he has chickens, he likes to work in the yard and do projects around the house on a daily basis  Last Dental Exam: up to date Last Eye Exam: up to date  Depression: phq 9 is negative    03/15/2023    2:38 PM 09/05/2022    1:14 PM 04/18/2022    9:11 AM 03/28/2022    9:54 AM 01/23/2022   11:45 AM  Depression screen PHQ 2/9  Decreased Interest 0 0 0 0 0  Down, Depressed, Hopeless 0 0 0 0 0  PHQ - 2 Score 0 0 0 0 0  Altered sleeping 0 0 3 0 0  Tired, decreased energy 0 0 3 0 0  Change in appetite 0 0 0 0 0  Feeling bad or failure about yourself  0 0 0 0 0  Trouble concentrating 0 0 0 0 0  Moving slowly or fidgety/restless 0 0 0 0 0  Suicidal thoughts 0 0 0 0 0  PHQ-9 Score 0 0 6 0 0  Difficult doing work/chores Not difficult at all        Hypertension:  BP Readings from Last 3 Encounters:   05/03/23 122/82  04/05/23 128/76  03/15/23 (!) 164/102    Obesity: Wt Readings from Last 3 Encounters:  05/03/23 207 lb 14.4 oz (94.3 kg)  03/15/23 209 lb 8 oz (95 kg)  09/05/22 198 lb 9.6 oz (90.1 kg)   BMI Readings from Last 3 Encounters:  05/03/23 33.56 kg/m  03/15/23 33.81 kg/m  09/05/22 32.05 kg/m     Flowsheet Row [facility]  AUDIT-C Score 2       Married STD testing and prevention (HIV/chl/gon/syphilis):  not applicable Sexual history: no problems  Hep C Screening: completed Skin cancer: Discussed monitoring for atypical lesions Colorectal cancer: repeat in 2026  Prostate cancer:  not applicable Lab Results  Component Value  Date   PSA 1.60 11/24/2019   PSA 3.1 04/14/2018   PSA 1.3 04/09/2017     Lung cancer:  Low Dose CT Chest recommended if Age 24-80 years, 30 pack-year currently smoking OR have quit w/in 15years. Patient  is not a candidate for screening   AAA: The USPSTF recommends one-time screening with ultrasonography in men ages 67 to 75 years who have ever smoked. Patient   is a candidate for screening  ECG:  2024  Vaccines: reviewed with the patient. He will get Tdap today   Advanced Care Planning: A voluntary discussion about advance care planning including the explanation and discussion of advance directives.  Discussed health care proxy and Living will, and the patient was able to identify a health care proxy as wife .  Patient does have a living will and power of attorney of health care   Patient Active Problem List   Diagnosis Date Noted   Hypertension, benign 03/28/2022   Derangement of right knee 01/19/2021   Pre-diabetes 04/16/2018   Panic attack 05/20/2016   Abnormal EKG 05/18/2016   Anxiety about health 05/08/2016   Internal hemorrhoids 12/29/2015   Back pain, chronic 01/18/2015   Gastro-esophageal reflux disease without esophagitis 01/18/2015   Numerous moles 01/18/2015    Hyperglycemia 01/18/2015   History of fracture of femur 01/18/2015    Past Surgical History:  Procedure Laterality Date   APPENDECTOMY     COLONOSCOPY WITH PROPOFOL N/A 01/18/2020   Procedure: COLONOSCOPY WITH PROPOFOL;  Surgeon: Toney Reil, MD;  Location: [facility] ENDOSCOPY;  Service: Gastroenterology;  Laterality: N/A;   right femur internal fixiation     VASECTOMY      Family History  Problem Relation Age of Onset   CVA Mother    Cancer Father 58       Pancreatic   Diabetes Father    Depression Brother    Depression Daughter    Epilepsy Brother     Social History   Socioeconomic History   Marital status: Married    Spouse name: Toniann Fail   Number of children: 4   Years of education: Not on file   Highest education level: Some college, no degree  Occupational History   Occupation: Merchant navy officer: [employer]  Tobacco Use   Smoking status: Former    Current packs/day: 0.00    Average packs/day: 1 pack/day for 2.0 years (2.0 ttl pk-yrs)    Types: Cigarettes    Start date: 01/29/1986    Quit date: 01/30/1988    Years since quitting: 35.2   Smokeless tobacco: Never  Vaping Use   Vaping status: Never Used  Substance and Sexual Activity   Alcohol use: Yes    Alcohol/week: 2.0 standard drinks of alcohol    Types: 2 Cans of beer per week   Drug use: No   Sexual activity: Yes    Partners: Female    Birth control/protection: None  Other Topics Concern   Not on file  Social History Narrative   Married, they have 4 children.    Social Drivers of Corporate investment banker Strain: Low Risk  (05/03/2023)   Overall Financial Resource Strain (CARDIA)    Difficulty of Paying Living Expenses: Not hard at all  Food Insecurity: No Food Insecurity (05/03/2023)   Hunger Vital Sign    Worried About Running Out of Food in the Last Year: Never true    Ran Out of Food in the Last Year:  Never true  Transportation Needs: No Transportation Needs (05/03/2023)    PRAPARE - Administrator, Civil Service (Medical): No    Lack of Transportation (Non-Medical): No  Physical Activity: Inactive (05/03/2023)   Exercise Vital Sign    Days of Exercise per Week: 0 days    Minutes of Exercise per Session: 0 min  Stress: No Stress Concern Present (05/03/2023)   [employer] of Occupational Health - Occupational Stress Questionnaire    Feeling of Stress : Not at all  Social Connections: Socially Integrated (05/03/2023)   Social Connection and Isolation Panel [NHANES]    Frequency of Communication with Friends and Family: More than three times a week    Frequency of Social Gatherings with Friends and Family: More than three times a week    Attends Religious Services: 1 to 4 times per year    Active Member of [employer] or Organizations: No    Attends Banker Meetings: 1 to 4 times per year    Marital Status: Married  Catering manager Violence: Not At Risk (05/03/2023)   Humiliation, Afraid, Rape, and Kick questionnaire    Fear of Current or Ex-Partner: No    Emotionally Abused: No    Physically Abused: No    Sexually Abused: No     Current Outpatient Medications:    albuterol (VENTOLIN HFA) 108 (90 Base) MCG/ACT inhaler, Inhale 2 puffs into the lungs every 4 (four) hours as needed for wheezing or shortness of breath., Disp: 8 g, Rfl: 0   amLODipine-olmesartan (AZOR) 10-40 MG tablet, Take 1 tablet by mouth daily., Disp: 90 tablet, Rfl: 0   citalopram (CELEXA) 20 MG tablet, Take 1 tablet (20 mg total) by mouth daily., Disp: 90 tablet, Rfl: 1   famotidine (PEPCID) 40 MG tablet, Take 1 tablet (40 mg total) by mouth daily., Disp: 90 tablet, Rfl: 1   Multiple Vitamin (MULTIVITAMINS PO), Take 1 tablet by mouth daily., Disp: , Rfl:   Allergies  Allergen Reactions   Ace Inhibitors Cough     ROS  Constitutional: Negative for fever or weight change.  Respiratory: Negative for cough and shortness of breath.   Cardiovascular: Negative for  chest pain or palpitations.  Gastrointestinal: Negative for abdominal pain, no bowel changes.  Musculoskeletal: Negative for gait problem or joint swelling.  Skin: Negative for rash.  Neurological: Negative for dizziness or headache.  No other specific complaints in a complete review of systems (except as listed in HPI above).    Objective  Vitals:   05/03/23 1411  BP: 122/82  Pulse: 79  Resp: 16  SpO2: 95%  Weight: 207 lb 14.4 oz (94.3 kg)  Height: 5\' 6"  (1.676 m)    Body mass index is 33.56 kg/m.  Physical Exam  Constitutional: Patient appears well-developed and well-nourished. No distress.  HENT: Head: Normocephalic and atraumatic. Ears: B TMs ok, no erythema or effusion; Nose: Nose normal. Mouth/Throat: Oropharynx is clear and moist. No oropharyngeal exudate.  Eyes: Conjunctivae and EOM are normal. Pupils are equal, round, and reactive to light. No scleral icterus.  Neck: Normal range of motion. Neck supple. No JVD present. No thyromegaly present.  Cardiovascular: Normal rate, regular rhythm and normal heart sounds.  No murmur heard. No BLE edema. Pulmonary/Chest: Effort normal and breath sounds normal. No respiratory distress. Abdominal: Soft. Bowel sounds are normal, no distension. There is no tenderness. no masses MALE GENITALIA: Normal descended testes bilaterally, no masses palpated, no hernias, no lesions, no discharge RECTAL: not  done  Musculoskeletal: Normal range of motion, no joint effusions. No gross deformities Neurological: he is alert and oriented to person, place, and time. No cranial nerve deficit. Coordination, balance, strength, speech and gait are normal.  Skin: Skin is warm and dry. No rash noted. No erythema.  Psychiatric: Patient has a normal mood and affect. behavior is normal. Judgment and thought content normal.     Assessment & Plan  1. Well adult exam (Primary)  - Tdap vaccine greater than or equal to 7yo IM - Lipid panel - Hemoglobin  A1c  2. Need for Tdap vaccination  - Tdap vaccine greater than or equal to 7yo IM  3. Dyslipidemia  - Lipid panel  4. Pre-diabetes  - Hemoglobin A1c    -Prostate cancer screening and PSA options (with potential risks and benefits of testing vs not testing) were discussed along with recent recs/guidelines. -USPSTF grade A and B recommendations reviewed with patient; age-appropriate recommendations, preventive care, screening tests, etc discussed and encouraged; healthy living encouraged; see AVS for patient education given to patient -Discussed importance of 150 minutes of physical activity weekly, eat two servings of fish weekly, eat one serving of tree nuts ( cashews, pistachios, pecans, almonds.Marland Kitchen) every other day, eat 6 servings of fruit/vegetables daily and drink plenty of water and avoid sweet beverages.  -Reviewed Health Maintenance: yes

## 2023-05-06 DIAGNOSIS — R7303 Prediabetes: Secondary | ICD-10-CM | POA: Diagnosis not present

## 2023-05-06 DIAGNOSIS — Z0000 Encounter for general adult medical examination without abnormal findings: Secondary | ICD-10-CM | POA: Diagnosis not present

## 2023-05-07 ENCOUNTER — Encounter: Payer: Self-pay | Admitting: Family Medicine

## 2023-05-07 LAB — LIPID PANEL
Chol/HDL Ratio: 4.4 ratio (ref 0.0–5.0)
Cholesterol, Total: 192 mg/dL (ref 100–199)
HDL: 44 mg/dL (ref >39)
LDL Chol Calc (NIH): 134 mg/dL — ABNORMAL HIGH (ref 0–99)
Triglycerides: 74 mg/dL (ref 0–149)
VLDL Cholesterol Cal: 14 mg/dL (ref 5–40)

## 2023-05-07 LAB — HEMOGLOBIN A1C
Est. average glucose Bld gHb Est-mCnc: 128 mg/dL
Hgb A1c MFr Bld: 6.1 % — ABNORMAL HIGH (ref 4.8–5.6)

## 2023-06-04 ENCOUNTER — Other Ambulatory Visit: Payer: Self-pay | Admitting: Family Medicine

## 2023-07-11 ENCOUNTER — Other Ambulatory Visit: Payer: Self-pay | Admitting: Family Medicine

## 2023-07-22 ENCOUNTER — Encounter: Payer: Self-pay | Admitting: Family Medicine

## 2023-07-23 ENCOUNTER — Other Ambulatory Visit: Payer: Self-pay | Admitting: Family Medicine

## 2023-07-23 MED ORDER — OLMESARTAN-AMLODIPINE-HCTZ 40-5-12.5 MG PO TABS: 1.0000 | ORAL_TABLET | Freq: Every day | ORAL | 0 refills | Status: DC

## 2023-08-14 ENCOUNTER — Other Ambulatory Visit: Payer: Self-pay | Admitting: Family Medicine

## 2023-09-29 ENCOUNTER — Other Ambulatory Visit: Payer: Self-pay | Admitting: Family Medicine

## 2023-09-29 DIAGNOSIS — F410 Panic disorder [episodic paroxysmal anxiety] without agoraphobia: Secondary | ICD-10-CM

## 2023-09-29 DIAGNOSIS — F411 Generalized anxiety disorder: Secondary | ICD-10-CM

## 2023-10-17 ENCOUNTER — Other Ambulatory Visit: Payer: Self-pay | Admitting: Family Medicine

## 2023-10-17 DIAGNOSIS — K219 Gastro-esophageal reflux disease without esophagitis: Secondary | ICD-10-CM

## 2023-10-21 DIAGNOSIS — M2391 Unspecified internal derangement of right knee: Secondary | ICD-10-CM | POA: Diagnosis not present

## 2023-10-21 DIAGNOSIS — M25561 Pain in right knee: Secondary | ICD-10-CM | POA: Diagnosis not present

## 2023-10-25 ENCOUNTER — Other Ambulatory Visit: Payer: Self-pay | Admitting: Family Medicine

## 2023-10-25 DIAGNOSIS — F410 Panic disorder [episodic paroxysmal anxiety] without agoraphobia: Secondary | ICD-10-CM

## 2023-10-25 DIAGNOSIS — F411 Generalized anxiety disorder: Secondary | ICD-10-CM

## 2023-11-08 ENCOUNTER — Ambulatory Visit: Admitting: Family Medicine

## 2023-11-08 ENCOUNTER — Encounter: Payer: Self-pay | Admitting: Family Medicine

## 2023-11-08 VITALS — BP 126/74 | HR 84 | Resp 16 | Ht 66.0 in | Wt 202.7 lb

## 2023-11-08 DIAGNOSIS — K219 Gastro-esophageal reflux disease without esophagitis: Secondary | ICD-10-CM | POA: Diagnosis not present

## 2023-11-08 DIAGNOSIS — F411 Generalized anxiety disorder: Secondary | ICD-10-CM

## 2023-11-08 DIAGNOSIS — E785 Hyperlipidemia, unspecified: Secondary | ICD-10-CM

## 2023-11-08 DIAGNOSIS — F410 Panic disorder [episodic paroxysmal anxiety] without agoraphobia: Secondary | ICD-10-CM

## 2023-11-08 DIAGNOSIS — I10 Essential (primary) hypertension: Secondary | ICD-10-CM | POA: Diagnosis not present

## 2023-11-08 DIAGNOSIS — R7303 Prediabetes: Secondary | ICD-10-CM | POA: Diagnosis not present

## 2023-11-08 MED ORDER — OLMESARTAN-AMLODIPINE-HCTZ 40-5-12.5 MG PO TABS
1.0000 | ORAL_TABLET | Freq: Every day | ORAL | 1 refills | Status: AC
Start: 2023-11-08 — End: ?

## 2023-11-08 MED ORDER — CITALOPRAM HYDROBROMIDE 20 MG PO TABS: 20.0000 mg | ORAL_TABLET | Freq: Every day | ORAL | 1 refills | Status: AC

## 2023-11-08 MED ORDER — FAMOTIDINE 40 MG PO TABS: 40.0000 mg | ORAL_TABLET | Freq: Every day | ORAL | 1 refills | Status: AC

## 2023-11-08 NOTE — Progress Notes (Signed)
 Name: Kevin Bernard   MRN: [id number]    DOB: 07-26-1965   Date:11/08/2023       Progress Note  Subjective  Chief Complaint  Chief Complaint  Patient presents with   Medical Management of Chronic Issues   Discussed the use of AI scribe software for clinical note transcription with the patient, who gave verbal consent to proceed.  History of Present Illness Kevin Bernard is a 58 year old male with hypertension and prediabetes who presents for a six-month follow-up.  He is on a medication regimen for hypertension that includes olmesartan , amlodipine , and hydrochlorothiazide at a dose of 40/5/12.5 mg. No side effects are reported, and his blood pressure is well-controlled with regular home monitoring. No chest pain, palpitations, or shortness of breath during physical activities, although he experiences discomfort when bending over due to his weight.  His weight has decreased from 208 lbs in April to 202 lbs, attributed to dietary changes influenced by his wife's diet. He limits his food intake and prefers home-cooked meals, which typically include chicken, pork, and occasionally beef, with an effort to include more vegetables when time allows.  Regarding prediabetes, his A1c was 6.1% in April, up from previous levels of 5.7% to 5.9%. He is not on metformin and is managing his condition through dietary changes, particularly by reducing carbohydrate and sugar intake. He occasionally indulges in sweets, especially when his mother-in-law visits, but tries to be mindful of his sugar consumption.  He has a history of generalized anxiety disorder and previously experienced panic attacks, but his anxiety is well-managed with citalopram , which he takes once daily. He has not had any panic attacks in years.  He takes famotidine  for reflux, which effectively controls his symptoms with a once-daily dose.  Recent lab work showed normal kidney function, thyroid  levels, and cholesterol levels, although his  LDL cholesterol was slightly elevated. He is not on cholesterol medication and prefers to monitor his levels.     The 10-year ASCVD risk score (Arnett DK, et al., 2019) is: 6.9%   Values used to calculate the score:     Age: 19 years     Clincally relevant sex: Male     Is Non-Hispanic African American: No     Diabetic: No     Tobacco smoker: No     Systolic Blood Pressure: 126 mmHg     Is BP treated: No     HDL Cholesterol: 44 mg/dL     Total Cholesterol: 192 mg/dL    Patient Active Problem List   Diagnosis Date Noted   Hypertension, benign 03/28/2022   Derangement of right knee 01/19/2021   Pre-diabetes 04/16/2018   Panic attack 05/20/2016   Abnormal EKG 05/18/2016   Anxiety about health 05/08/2016   Internal hemorrhoids 12/29/2015   Back pain, chronic 01/18/2015   Gastro-esophageal reflux disease without esophagitis 01/18/2015   Numerous moles 01/18/2015   Hyperglycemia 01/18/2015   History of fracture of femur 01/18/2015    Past Surgical History:  Procedure Laterality Date   APPENDECTOMY     COLONOSCOPY WITH PROPOFOL  N/A 01/18/2020   Procedure: COLONOSCOPY WITH PROPOFOL ;  Surgeon: Unk Corinn Skiff, MD;  Location: [facility] ENDOSCOPY;  Service: Gastroenterology;  Laterality: N/A;   right femur internal fixiation     VASECTOMY      Family History  Problem Relation Age of Onset   CVA Mother    Cancer Father 65       Pancreatic   Diabetes Father  Depression Brother    Depression Daughter    Epilepsy Brother     Social History   Tobacco Use   Smoking status: Former    Current packs/day: 0.00    Average packs/day: 1 pack/day for 2.0 years (2.0 ttl pk-yrs)    Types: Cigarettes    Start date: 01/29/1986    Quit date: 01/30/1988    Years since quitting: 35.7   Smokeless tobacco: Never  Substance Use Topics   Alcohol use: Yes    Alcohol/week: 2.0 standard drinks of alcohol    Types: 2 Cans of beer per week     Current Outpatient Medications:    albuterol   (VENTOLIN  HFA) 108 (90 Base) MCG/ACT inhaler, Inhale 2 puffs into the lungs every 4 (four) hours as needed for wheezing or shortness of breath., Disp: 8 g, Rfl: 0   citalopram  (CELEXA ) 20 MG tablet, TAKE 1 TABLET BY MOUTH EVERY DAY, Disp: 30 tablet, Rfl: 0   famotidine  (PEPCID ) 40 MG tablet, TAKE 1 TABLET BY MOUTH EVERY DAY, Disp: 30 tablet, Rfl: 0   Multiple Vitamin (MULTIVITAMINS PO), Take 1 tablet by mouth daily., Disp: , Rfl:    Olmesartan -amLODIPine -HCTZ 40-5-12.5 MG TABS, TAKE 1 TABLET BY MOUTH EVERY DAY, Disp: 90 tablet, Rfl: 0  Allergies  Allergen Reactions   Ace Inhibitors Cough    I personally reviewed active problem list, medication list, allergies with the patient/caregiver today.   ROS  Ten systems reviewed and is negative except as mentioned in HPI    Objective Physical Exam  CONSTITUTIONAL: Patient appears well-developed and well-nourished. No distress. HEENT: Head atraumatic, normocephalic, neck supple. CARDIOVASCULAR: Normal rate, regular rhythm and normal heart sounds. No murmur heard. No BLE edema. No swelling in extremities. PULMONARY: Effort normal and breath sounds normal. No respiratory distress. Lungs clear to auscultation. ABDOMINAL: There is no tenderness or distention. MUSCULOSKELETAL: Normal gait. Without gross motor or sensory deficit. PSYCHIATRIC: Patient has a normal mood and affect. Behavior is normal. Judgment and thought content normal.  Vitals:   11/08/23 1433  BP: 126/74  Pulse: 84  Resp: 16  SpO2: 95%  Weight: 202 lb 11.2 oz (91.9 kg)  Height: 5' 6 (1.676 m)    Body mass index is 32.72 kg/m.    PHQ2/9:    11/08/2023    2:33 PM 03/15/2023    2:38 PM 09/05/2022    1:14 PM 04/18/2022    9:11 AM 03/28/2022    9:54 AM  Depression screen PHQ 2/9  Decreased Interest 0 0 0 0 0  Down, Depressed, Hopeless 0 0 0 0 0  PHQ - 2 Score 0 0 0 0 0  Altered sleeping  0 0 3 0  Tired, decreased energy  0 0 3 0  Change in appetite  0 0 0 0   Feeling bad or failure about yourself   0 0 0 0  Trouble concentrating  0 0 0 0  Moving slowly or fidgety/restless  0 0 0 0  Suicidal thoughts  0 0 0 0  PHQ-9 Score  0 0 6 0  Difficult doing work/chores  Not difficult at all       phq 9 is negative  Fall Risk:    11/08/2023    2:33 PM 03/15/2023    2:38 PM 09/05/2022    1:14 PM 04/18/2022    9:11 AM 03/28/2022    9:53 AM  Fall Risk   Falls in the past year? 0 0 0 0 0  Number  falls in past yr: 0 0  0   Injury with Fall? 0 0  0   Risk for fall due to : No Fall Risks No Fall Risks No Fall Risks No Fall Risks No Fall Risks  Follow up Falls evaluation completed Falls prevention discussed;Education provided;Falls evaluation completed Falls prevention discussed Falls prevention discussed Falls prevention discussed      Assessment & Plan Essential hypertension Blood pressure controlled with olmesartan , amlodipine , and HCTZ. No side effects or symptoms reported. - Continue olmesartan , amlodipine , and HCTZ.  Prediabetes A1c at 6.1% indicates prediabetes. Prefers dietary management over metformin. - Encourage reduced carbohydrate intake, including desserts, sugar beverages, potatoes, pasta, and rice.  Hyperlipidemia Slightly elevated LDL. 6.9% risk for heart attack and stroke, below treatment threshold. Prefers monitoring over statin. - Monitor cholesterol levels regularly.  Overweight Weight decreased from 208 lbs to 202 lbs since April. Actively reducing portion sizes. - Continue dietary efforts for weight reduction. - Encourage regular weight monitoring.  Gastroesophageal reflux disease Reflux controlled with famotidine . Weight loss may improve symptoms. - Continue famotidine . - Encourage weight loss.  Generalized anxiety disorder Anxiety managed with citalopram . No recent panic attacks. - Continue citalopram .

## 2024-02-23 ENCOUNTER — Other Ambulatory Visit: Payer: Self-pay | Admitting: Family Medicine

## 2024-02-24 NOTE — Telephone Encounter (Signed)
 Requested Prescriptions  Refused Prescriptions Disp Refills   Olmesartan -amLODIPine -HCTZ 40-5-12.5 MG TABS [Pharmacy Med Name: OLMSRTN-AMLDPN-HCTZ 40-5-12.5] 90 tablet 1    Sig: TAKE 1 TABLET BY MOUTH EVERY DAY     Cardiovascular: CCB + ARB + Diuretic Combos Failed - 02/24/2024  2:40 PM      Failed - K in normal range and within 180 days    Potassium  Date Value Ref Range Status  03/15/2023 4.3 3.5 - 5.2 mmol/L Final         Failed - Na in normal range and within 180 days    Sodium  Date Value Ref Range Status  03/15/2023 142 134 - 144 mmol/L Final         Failed - Cr in normal range and within 180 days    Creat  Date Value Ref Range Status  11/24/2019 1.12 0.70 - 1.33 mg/dL Final    Comment:    For patients >59 years of age, the reference limit for Creatinine is approximately 13% higher for people identified as African-American. .    Creatinine, Ser  Date Value Ref Range Status  03/15/2023 0.91 0.76 - 1.27 mg/dL Final         Failed - eGFR is 10 or above and within 180 days    GFR, Est African American  Date Value Ref Range Status  11/24/2019 86 > OR = 60 mL/min/1.51m2 Final   GFR, Est Non African American  Date Value Ref Range Status  11/24/2019 75 > OR = 60 mL/min/1.11m2 Final   eGFR  Date Value Ref Range Status  03/15/2023 98 >59 mL/min/1.73 Final         Passed - Patient is not pregnant      Passed - Last BP in normal range    BP Readings from Last 1 Encounters:  11/08/23 126/74         Passed - Last Heart Rate in normal range    Pulse Readings from Last 1 Encounters:  11/08/23 84         Passed - Valid encounter within last 6 months    Recent Outpatient Visits           3 months ago Hypertension, benign   [facility] Glenard Mire, MD   9 months ago Well adult exam   [facility] Glenard Mire, MD   11 months ago Uncontrolled hypertension   [facility] [place],  [place]richna, MD

## 2024-05-08 ENCOUNTER — Encounter: Admitting: Family Medicine
